# Patient Record
Sex: Male | Born: 1980
Health system: Southern US, Community
[De-identification: ages and names within clinical notes are randomized; demographics above are authoritative.]

## PROBLEM LIST (undated history)

## (undated) ENCOUNTER — Emergency Department (HOSPITAL_COMMUNITY): Payer: BC Managed Care – PPO

## (undated) DIAGNOSIS — S43006A Unspecified dislocation of unspecified shoulder joint, initial encounter: Secondary | ICD-10-CM

---

## 2000-10-15 ENCOUNTER — Encounter: Payer: Self-pay | Admitting: Emergency Medicine

## 2000-10-15 ENCOUNTER — Emergency Department (HOSPITAL_COMMUNITY): Admission: EM | Admit: 2000-10-15 | Discharge: 2000-10-15 | Payer: Self-pay | Admitting: Emergency Medicine

## 2005-12-26 ENCOUNTER — Emergency Department (HOSPITAL_COMMUNITY): Admission: EM | Admit: 2005-12-26 | Discharge: 2005-12-26 | Payer: Self-pay | Admitting: Emergency Medicine

## 2009-08-23 ENCOUNTER — Emergency Department (HOSPITAL_COMMUNITY): Admission: EM | Admit: 2009-08-23 | Discharge: 2009-08-23 | Payer: Self-pay | Admitting: Emergency Medicine

## 2011-03-09 ENCOUNTER — Emergency Department (INDEPENDENT_AMBULATORY_CARE_PROVIDER_SITE_OTHER): Payer: Worker's Compensation

## 2011-03-09 ENCOUNTER — Emergency Department (HOSPITAL_BASED_OUTPATIENT_CLINIC_OR_DEPARTMENT_OTHER)
Admission: EM | Admit: 2011-03-09 | Discharge: 2011-03-09 | Disposition: A | Discharge status: Home / Self Care | Payer: Worker's Compensation | Attending: Emergency Medicine | Admitting: Emergency Medicine

## 2011-03-09 ENCOUNTER — Encounter: Payer: Self-pay | Admitting: Family Medicine

## 2011-03-09 ENCOUNTER — Emergency Department (HOSPITAL_BASED_OUTPATIENT_CLINIC_OR_DEPARTMENT_OTHER): Payer: Worker's Compensation

## 2011-03-09 DIAGNOSIS — M24419 Recurrent dislocation, unspecified shoulder: Secondary | ICD-10-CM | POA: Insufficient documentation

## 2011-03-09 DIAGNOSIS — S43016A Anterior dislocation of unspecified humerus, initial encounter: Secondary | ICD-10-CM

## 2011-03-09 DIAGNOSIS — X58XXXA Exposure to other specified factors, initial encounter: Secondary | ICD-10-CM

## 2011-03-09 MED ORDER — FENTANYL CITRATE 0.05 MG/ML IJ SOLN
100.0000 ug | Freq: Once | INTRAMUSCULAR | Status: AC
  Administered 2011-03-09: 100 ug via INTRAVENOUS
  Filled 2011-03-09: qty 2

## 2011-03-09 NOTE — ED Notes (Signed)
Vital signs stable. 

## 2011-03-09 NOTE — ED Notes (Signed)
Pt set up for conscious sedation procedure and placed on [place] 3 lpm for procedure.

## 2011-03-09 NOTE — ED Notes (Signed)
Patient denies pain and is resting comfortably.  

## 2011-03-09 NOTE — ED Notes (Signed)
Patient is resting comfortably. 

## 2011-03-09 NOTE — ED Notes (Addendum)
Pt c/o left shoulder injury while at work. Pt sts "I think it's dislocated". Pt reports h/o dislocation to same shoulder. Cms intact. Pt sts UDS performed prior to arrival.

## 2011-03-09 NOTE — ED Notes (Signed)
Dr. Radford Pax at Pt. Bedside attempting reduction (closed) of the L shoulder.

## 2011-03-09 NOTE — ED Notes (Signed)
Pt. Is in SR on cardiac monitor with no noted ectopy.

## 2011-03-09 NOTE — ED Notes (Signed)
Pt. Is in pain with deformity to the L shoulder.  Pt. Is able to move the L hand and L wrist

## 2011-03-09 NOTE — ED Notes (Signed)
Pt. Tolerating manipulation of the L shoulder by Dr. Radford Pax

## 2011-03-09 NOTE — ED Notes (Signed)
Post reduction x-ray was done and Pt. Tolerated well.

## 2011-03-09 NOTE — ED Notes (Signed)
Pt. Aware of not to drive tonight.

## 2011-03-09 NOTE — ED Provider Notes (Addendum)
History     CSN: [id number] Arrival date & time: 03/09/2011  6:28 PM   First MD Initiated Contact with Patient 03/09/11 1832      Chief Complaint  Patient presents with  . Shoulder Injury     HPI Patient presents with shoulder dislocation just prior to arrival.  Patient has history of dislocation in the past.  No other complaints. History reviewed. No pertinent past medical history.  History reviewed. No pertinent past surgical history.  No family history on file.  History  Substance Use Topics  . Smoking status: Never Smoker   . Smokeless tobacco: Not on file  . Alcohol Use: No      Review of Systems  All other systems reviewed and are negative.    Allergies  Review of patient's allergies indicates no known allergies.  Home Medications  No current outpatient prescriptions on file.  BP 138/82  Pulse 60  Temp(Src) 97.8 F (36.6 C) (Oral)  Resp 16  Ht 5\' 9"  (1.753 m)  Wt 205 lb (92.987 kg)  BMI 30.27 kg/m2  SpO2 100%  Physical Exam  Constitutional: He is oriented to person, place, and time. He appears well-developed and well-nourished.  Eyes: Pupils are equal, round, and reactive to light.  Neck: Normal range of motion.  Cardiovascular: Normal rate.   Pulmonary/Chest: Effort normal.  Abdominal: Soft.  Musculoskeletal:       Left shoulder: He exhibits deformity, pain and spasm.  Neurological: He is alert and oriented to person, place, and time.  Skin: Skin is warm and dry.    ED Course  Procedures (including critical care time) Initial x-ray showed anterior shoulder dislocation on the left     A closed reduction of anterior shoulder dislocation was done with massage and encouragement.  The reduction occurred spontaneously after some mild manipulation.  Postreduction films were read as normal.  Plan is to place the patient in 8 And followup with primary care doctor or orthopedist   MDM          Nelia Shi, MD 03/09/11  1841  Nelia Shi, MD 03/09/11 2020

## 2011-03-09 NOTE — ED Notes (Signed)
Dr. Radford Pax aware of Pt. Ready and has had the Fentanyl  Ordered with stable vitals.

## 2011-03-20 ENCOUNTER — Encounter (HOSPITAL_COMMUNITY): Payer: Self-pay | Admitting: Emergency Medicine

## 2011-03-20 ENCOUNTER — Emergency Department (HOSPITAL_COMMUNITY)
Admission: EM | Admit: 2011-03-20 | Discharge: 2011-03-20 | Disposition: A | Discharge status: Home / Self Care | Payer: PRIVATE HEALTH INSURANCE | Attending: Emergency Medicine | Admitting: Emergency Medicine

## 2011-03-20 DIAGNOSIS — S43005A Unspecified dislocation of left shoulder joint, initial encounter: Secondary | ICD-10-CM

## 2011-03-20 DIAGNOSIS — M24419 Recurrent dislocation, unspecified shoulder: Secondary | ICD-10-CM | POA: Insufficient documentation

## 2011-03-20 MED ORDER — NAPROXEN 500 MG PO TABS: 500.0000 mg | ORAL_TABLET | Freq: Two times a day (BID) | ORAL | Status: DC

## 2011-03-20 MED ORDER — FENTANYL CITRATE 0.05 MG/ML IJ SOLN
INTRAMUSCULAR | Status: AC
  Administered 2011-03-20: 100 ug via INTRAVENOUS
  Filled 2011-03-20: qty 2

## 2011-03-20 MED ORDER — SODIUM CHLORIDE 0.9 % IV SOLN
Freq: Once | INTRAVENOUS | Status: AC
  Administered 2011-03-20: 02:00:00 via INTRAVENOUS

## 2011-03-20 MED ORDER — FENTANYL CITRATE 0.05 MG/ML IJ SOLN
100.0000 ug | Freq: Once | INTRAMUSCULAR | Status: AC
  Administered 2011-03-20: 100 ug via INTRAVENOUS

## 2011-03-20 NOTE — ED Provider Notes (Addendum)
History     CSN: [id number] Arrival date & time: 03/20/2011  1:42 AM   First MD Initiated Contact with Patient 03/20/11 0143      Chief Complaint  Patient presents with  . Shoulder Injury    (Consider location/radiation/quality/duration/timing/severity/associated sxs/prior treatment) HPI Comments: 30 year old male with a history of left shoulder dislocation that has occurred twice in the past. Once several years ago and then again 5 days ago. Today he returns because of a redislocation while he was not wearing his shoulder immobilizer. Symptoms were acute in onset just prior to arrival, severe, worse with palpation or movement of the left arm. No associated numbness, weakness or tingling in the hand. Symptoms were acute in onset  Patient is a 30 y.o. male presenting with shoulder injury. The history is provided by the patient and medical records.  Shoulder Injury This is a recurrent problem.    History reviewed. No pertinent past medical history.  History reviewed. No pertinent past surgical history.  History reviewed. No pertinent family history.  History  Substance Use Topics  . Smoking status: Never Smoker   . Smokeless tobacco: Not on file  . Alcohol Use: Yes     rarely      Review of Systems  Musculoskeletal:       Shoulder pain  Skin: Negative for rash and wound.  Neurological: Negative for weakness and numbness.    Allergies  Review of patient's allergies indicates no known allergies.  Home Medications   Current Outpatient Rx  Name Route Sig Dispense Refill  . NAPROXEN 500 MG PO TABS Oral Take 1 tablet (500 mg total) by mouth 2 (two) times daily. 30 tablet 0    BP 149/94  Pulse 61  Temp(Src) 97.5 F (36.4 C) (Oral)  Resp 14  Ht 5\' 9"  (1.753 m)  Wt 205 lb (92.987 kg)  BMI 30.27 kg/m2  SpO2 100%  Physical Exam  Nursing note and vitals reviewed. Constitutional: He appears well-developed and well-nourished. He appears distressed ( uncomfortable  appearing).  HENT:  Head: Normocephalic and atraumatic.  Mouth/Throat: Oropharynx is clear and moist. No oropharyngeal exudate.  Eyes: Conjunctivae and EOM are normal. Pupils are equal, round, and reactive to light. Right eye exhibits no discharge. Left eye exhibits no discharge. No scleral icterus.  Neck: Normal range of motion. Neck supple. No JVD present. No thyromegaly present.  Cardiovascular: Normal rate, regular rhythm, normal heart sounds and intact distal pulses.  Exam reveals no gallop and no friction rub.   No murmur heard. Pulmonary/Chest: Effort normal and breath sounds normal. No respiratory distress. He has no wheezes. He has no rales.  Abdominal: Soft. Bowel sounds are normal. He exhibits no distension and no mass. There is no tenderness.  Musculoskeletal: He exhibits tenderness ( Tenderness over the left shoulder. There is obvious deformity of the left shoulder. He is holding his arm in internal rotation.). He exhibits no edema.  Lymphadenopathy:    He has no cervical adenopathy.  Neurological: He is alert. Coordination normal.       Normal grip, sensation, radial pulse at the left arm.  Skin: Skin is warm and dry. No rash noted. No erythema.  Psychiatric: He has a normal mood and affect. His behavior is normal.    ED Course  Reduction of dislocation Date/Time: 03/20/2011 2:30 AM Performed by: Eber Hong D Authorized by: Eber Hong D Consent: Verbal consent obtained. Risks and benefits: risks, benefits and alternatives were discussed Consent given by: patient Patient understanding:  patient states understanding of the procedure being performed Patient consent: the patient's understanding of the procedure matches consent given Procedure consent: procedure consent matches procedure scheduled Relevant documents: relevant documents present and verified Patient identity confirmed: verbally with patient and arm band Time out: Immediately prior to procedure a "time out"  was called to verify the correct patient, procedure, equipment, support staff and site/side marked as required. Local anesthesia used: no Patient sedated: no Patient tolerance: Patient tolerated the procedure well with no immediate complications. Comments: 100 mcg of fentanyl was given intravenously prior to the reduction. Patient was placed in the upright position, scapular manipulation by assistant while I apply gentle traction and external rotation to the left upper extremity. Shoulder reduced without difficulty.  And deformity, pain immediately improved   (including critical care time)  Labs Reviewed - No data to display No results found.   1. Dislocation of left shoulder joint       MDM  Dislocation of the left shoulder. The patient is left-hand dominant. See reduction note above. Sling placed and followup arranged verbally with the patient. The patient states he wants to followup with his workup pointed orthopedist. Recommended rice therapy and anti-inflammatories.        Vida Roller, MD 03/20/11 0865  Vida Roller, MD 03/20/11 [phone]

## 2011-03-20 NOTE — ED Notes (Signed)
Pt c/o left shoulder dislocation. Pt has history of same.

## 2012-01-28 ENCOUNTER — Emergency Department (HOSPITAL_BASED_OUTPATIENT_CLINIC_OR_DEPARTMENT_OTHER): Payer: PRIVATE HEALTH INSURANCE

## 2012-01-28 ENCOUNTER — Emergency Department (HOSPITAL_BASED_OUTPATIENT_CLINIC_OR_DEPARTMENT_OTHER)
Admission: EM | Admit: 2012-01-28 | Discharge: 2012-01-29 | Disposition: A | Discharge status: Home / Self Care | Payer: PRIVATE HEALTH INSURANCE | Attending: Emergency Medicine | Admitting: Emergency Medicine

## 2012-01-28 ENCOUNTER — Encounter (HOSPITAL_BASED_OUTPATIENT_CLINIC_OR_DEPARTMENT_OTHER): Payer: Self-pay | Admitting: *Deleted

## 2012-01-28 DIAGNOSIS — X503XXA Overexertion from repetitive movements, initial encounter: Secondary | ICD-10-CM | POA: Insufficient documentation

## 2012-01-28 DIAGNOSIS — S43006A Unspecified dislocation of unspecified shoulder joint, initial encounter: Secondary | ICD-10-CM | POA: Insufficient documentation

## 2012-01-28 MED ORDER — FENTANYL CITRATE 0.05 MG/ML IJ SOLN
100.0000 ug | Freq: Once | INTRAMUSCULAR | Status: AC
  Administered 2012-01-28: 100 ug via INTRAVENOUS
  Filled 2012-01-28: qty 2

## 2012-01-28 MED ORDER — ETOMIDATE 2 MG/ML IV SOLN
14.0000 mg | Freq: Once | INTRAVENOUS | Status: AC
  Administered 2012-01-28: 14 mg via INTRAVENOUS
  Filled 2012-01-28: qty 10

## 2012-01-28 MED ORDER — NALOXONE HCL 1 MG/ML IJ SOLN
INTRAMUSCULAR | Status: AC
  Filled 2012-01-28: qty 2

## 2012-01-28 MED ORDER — SODIUM CHLORIDE 0.9 % IV SOLN
Freq: Once | INTRAVENOUS | Status: AC
  Administered 2012-01-28: 20 mL/h via INTRAVENOUS

## 2012-01-28 MED ORDER — SODIUM CHLORIDE 0.9 % IV SOLN
INTRAVENOUS | Status: AC | PRN
  Administered 2012-01-28: 20 mL via INTRAVENOUS

## 2012-01-28 NOTE — ED Notes (Signed)
Closed reduction completed, pt tolerated well

## 2012-01-28 NOTE — ED Notes (Signed)
Left shoulder dislocated while moving boxes. Hx of same 5 or more times.

## 2012-01-28 NOTE — ED Provider Notes (Signed)
History   This chart was scribed for Hanley Seamen, MD by Sofie Rower. The patient was seen in room MH02/MH02 and the patient's care was started at 10:48PM    CSN: [id number]  Arrival date & time 01/28/12  2123   First MD Initiated Contact with Patient 01/28/12 2248      Chief Complaint  Patient presents with  . Shoulder Pain    (Consider location/radiation/quality/duration/timing/severity/associated sxs/prior treatment) Patient is a 31 y.o. male presenting with shoulder pain. The history is provided by the patient. No language interpreter was used.  Shoulder Pain This is a new problem. The current episode started 1 to 2 hours ago. The problem occurs constantly. The problem has been gradually worsening. Pertinent negatives include no chest pain, no abdominal pain, no headaches and no shortness of breath. The symptoms are aggravated by twisting and exertion. Nothing relieves the symptoms. He has tried nothing for the symptoms. The treatment provided no relief.    Julian Gibbs is a 31 y.o. male , with a hx of shoulder dislocation (X 5), who presents to the Emergency Department complaining of sudden, progressively worsening shoulder pain located at the left shoulder onset today (9:30PM). The pt reports he was carrying some boxes at his apartment this evening where he believes he dislocated his left shoulder. Modifying factors include certain movements and positions of the left shoulder which intensify the shoulder pain.  The pt denies tingling or numbness located in the left hand.  The pt does not smoke, however, drinks alcohol on occasion.      History reviewed. No pertinent past medical history.  History reviewed. No pertinent past surgical history.  No family history on file.  History  Substance Use Topics  . Smoking status: Never Smoker   . Smokeless tobacco: Not on file  . Alcohol Use: Yes     rarely      Review of Systems  Respiratory: Negative for shortness of  breath.   Cardiovascular: Negative for chest pain.  Gastrointestinal: Negative for abdominal pain.  Neurological: Negative for headaches.  All other systems reviewed and are negative.    Allergies  Review of patient's allergies indicates no known allergies.  Home Medications  No current outpatient prescriptions on file.  BP 129/85  Pulse 97  Temp 98.7 F (37.1 C) (Oral)  Resp 20  SpO2 98%  Physical Exam  Nursing note and vitals reviewed. Constitutional: He is oriented to person, place, and time. He appears well-developed and well-nourished.  HENT:  Head: Atraumatic.  Right Ear: External ear normal.  Left Ear: External ear normal.  Nose: Nose normal.  Eyes: Conjunctivae normal are normal.  Neck: Normal range of motion.  Cardiovascular: Normal rate, regular rhythm and normal heart sounds.   Pulmonary/Chest: Effort normal and breath sounds normal.  Musculoskeletal:       Left shoulder anterior dislocation. Left upper extremity distally and neurovascularly intact.   Neurological: He is alert and oriented to person, place, and time.  Skin: Skin is warm and dry.  Psychiatric: He has a normal mood and affect. His behavior is normal.    ED Course  Procedures (including critical care time)  DIAGNOSTIC STUDIES: Oxygen Saturation is 98% on room air, normal by my interpretation.    COORDINATION OF CARE:    10:51PM- Relocation of the shoulder and treatment plan discussed with pt. Pt agrees to treatment.   11:22PM- Relocation of shoulder. Pt agrees with treatment.   Procedural sedation Performed by: Hanley Seamen Consent:  Verbal and written consent obtained. Risks and benefits: risks, benefits and alternatives were discussed Required items: required blood products, implants, devices, and special equipment available Patient identity confirmed: arm band and provided demographic data Time out: Immediately prior to procedure a "time out" was called to verify the correct  patient, procedure, equipment, support staff and site/side marked as required.  Sedation type: moderate (conscious) sedation NPO time confirmed and considedered  Sedatives: ETOMIDATE  Physician Time at Bedside: 10  Vitals: Vital signs were monitored during sedation. Cardiac Monitor, pulse oximeter Patient tolerance: Patient tolerated the procedure well with no immediate complications. Comments: Pt with uneventful recovered. Returned to pre-procedural sedation baseline.  CLOSED REDUCTION The patient gave verbal and written consent for the procedure as noted above the procedural sedation note. The patient's left trapezius and pectoralis major muscles were massaged prior to reduction. Once adequate sedation was achieved the dislocation was reduced by gentle traction on the left upper arm. There were no complications and the patient tolerated this well. The patient was placed in a shoulder immobilizer. His left upper extremity remained neurovascularly intact post-procedure. Anatomical alignment was verified with post reduction radiograph.     MDM  Nursing notes and vitals signs, including pulse oximetry, reviewed.  Summary of this visit's results, reviewed by myself:   Imaging Studies: Dg Shoulder Left  01/28/2012  *RADIOLOGY REPORT*  Clinical Data: Shoulder pain.  LEFT SHOULDER - 2+ VIEW  Comparison: Plain films 03/09/2011.  Findings: The humerus is anteriorly dislocated with Hill-Sachs deformity identified.  Acromioclavicular joint is intact.  IMPRESSION: Anterior dislocation of the humerus with anterior dislocation of the humerus with a Hill-Sachs deformity.   Original Report Authenticated By: Bernadene Bell. D'ALESSIO, M.D.    Dg Shoulder Left Port  01/29/2012  *RADIOLOGY REPORT*  Clinical Data: Status post reduction of dislocation.  PORTABLE LEFT SHOULDER - 2+ VIEW  Comparison: Plain films 01/28/2012 at 2147 hours.  Findings: The humerus now appears located.  Hill-Sachs deformity noted.   IMPRESSION: Successful reduction of dislocation.   Original Report Authenticated By: Bernadene Bell. D'ALESSIO, M.D.    12:16 AM LUE neurovascularly intact. Patient requests referral to orthopedist for definitive treatment.      I personally performed the services described in this documentation, which was scribed in my presence.  The recorded information has been reviewed and considered.      Hanley Seamen, MD 01/29/12 [phone]

## 2012-01-29 MED ORDER — HYDROCODONE-ACETAMINOPHEN 5-325 MG PO TABS: 1.0000 | ORAL_TABLET | Freq: Four times a day (QID) | ORAL | Status: AC | PRN

## 2012-02-01 ENCOUNTER — Emergency Department (HOSPITAL_BASED_OUTPATIENT_CLINIC_OR_DEPARTMENT_OTHER)
Admission: EM | Admit: 2012-02-01 | Discharge: 2012-02-01 | Disposition: A | Discharge status: Home / Self Care | Payer: PRIVATE HEALTH INSURANCE | Attending: Emergency Medicine | Admitting: Emergency Medicine

## 2012-02-01 ENCOUNTER — Encounter (HOSPITAL_BASED_OUTPATIENT_CLINIC_OR_DEPARTMENT_OTHER): Payer: Self-pay | Admitting: Emergency Medicine

## 2012-02-01 ENCOUNTER — Emergency Department (HOSPITAL_BASED_OUTPATIENT_CLINIC_OR_DEPARTMENT_OTHER): Payer: PRIVATE HEALTH INSURANCE

## 2012-02-01 DIAGNOSIS — X500XXA Overexertion from strenuous movement or load, initial encounter: Secondary | ICD-10-CM | POA: Insufficient documentation

## 2012-02-01 DIAGNOSIS — S43016A Anterior dislocation of unspecified humerus, initial encounter: Secondary | ICD-10-CM | POA: Insufficient documentation

## 2012-02-01 DIAGNOSIS — S43005A Unspecified dislocation of left shoulder joint, initial encounter: Secondary | ICD-10-CM

## 2012-02-01 DIAGNOSIS — Y92009 Unspecified place in unspecified non-institutional (private) residence as the place of occurrence of the external cause: Secondary | ICD-10-CM | POA: Insufficient documentation

## 2012-02-01 MED ORDER — NAPROXEN 500 MG PO TABS: 500.0000 mg | ORAL_TABLET | Freq: Two times a day (BID) | ORAL | Status: DC

## 2012-02-01 MED ORDER — KETOROLAC TROMETHAMINE 60 MG/2ML IM SOLN
60.0000 mg | Freq: Once | INTRAMUSCULAR | Status: AC
  Administered 2012-02-01: 60 mg via INTRAMUSCULAR
  Filled 2012-02-01: qty 2

## 2012-02-01 NOTE — ED Notes (Signed)
Dislocated his left shoulder during sleep.  Hx of same on Monday.  Seen here

## 2012-02-01 NOTE — ED Provider Notes (Signed)
History     CSN: [id number]  Arrival date & time 02/01/12  0545   First MD Initiated Contact with Patient 02/01/12 0615      Chief Complaint  Patient presents with  . Dislocation    (Consider location/radiation/quality/duration/timing/severity/associated sxs/prior treatment) HPI Comments: Julian Gibbs presents with recurrent L shoulder pain - he has hx of recurrent shoulder dislocations X 7 over the last year since he first dislocated it.  He states that 4 days ago he had similar problem and was seen in the ER - Julian shows that pt underwent procedural sedation with etomidate and had successful relocation of the L shoulder - was placed in sling - He had taken teh sling off tonight and awoke from sleep on his stomach with his arm out to the side with pain and deformity.  The pain is constant, worse with movement of the L arm and not associated with numbness of the fingers.  The history is provided by the patient and medical records.    History reviewed. No pertinent past medical history.  History reviewed. No pertinent past surgical history.  No family history on file.  History  Substance Use Topics  . Smoking status: Never Smoker   . Smokeless tobacco: Not on file  . Alcohol Use: Yes     rarely      Review of Systems  Musculoskeletal: Positive for joint swelling.  Neurological: Negative for weakness and numbness.    Allergies  Review of patient's allergies indicates no known allergies.  Home Medications   Current Outpatient Rx  Name Route Sig Dispense Refill  . HYDROCODONE-ACETAMINOPHEN 5-325 MG PO TABS Oral Take 1-2 tablets by mouth every 6 (six) hours as needed for pain. 20 tablet 0  . NAPROXEN 500 MG PO TABS Oral Take 1 tablet (500 mg total) by mouth 2 (two) times daily with a meal. 30 tablet 0    BP 130/87  Pulse 58  Temp 97.9 F (36.6 C) (Oral)  Resp 16  Ht 5\' 10"  (1.778 m)  Wt 205 lb (92.987 kg)  BMI 29.41 kg/m2  SpO2 99%  Physical Exam  Nursing note  and vitals reviewed. Constitutional: He appears well-developed and well-nourished.  HENT:  Head: Normocephalic and atraumatic.  Eyes: Conjunctivae normal are normal. No scleral icterus.  Pulmonary/Chest: Effort normal.  Musculoskeletal: He exhibits tenderness ( ttp over the anterior L shoulder - associated deformity of the L shoulder). He exhibits no edema.  Neurological: He is alert. Coordination normal.       Normal grip and sensation to the L hand  Skin: Skin is warm and dry. No rash noted.    ED Course  Procedures (including critical care time)  Labs Reviewed - No data to display Dg Shoulder Left  02/01/2012  *RADIOLOGY REPORT*  Clinical Data: Left shoulder pain.  LEFT SHOULDER - 2+ VIEW  Comparison: Plain films 01/28/2012.  Findings: Left humerus is anteriorly dislocated.  A Hill-Sachs deformity.  The acromioclavicular joint is intact.  Imaged left lung and ribs are unremarkable.  IMPRESSION: Anterior dislocation left shoulder with associated Hill-Sachs deformity.   Original Report Authenticated By: Bernadene Bell. D'ALESSIO, M.D.      1. Dislocation of left shoulder joint       MDM  Prominent AC joint, likely recurrent anterior / inferior shoulder dislocation - pt has lax capsule and was amenable to reduction using gentle traction.  Please see below note: no sedation was required - IM Toradol given, The joint was clinically reduced,  returned to a normal anatomy and position clinically and pain free after procedure.  Due to the number of xrays that he has had for this same injury, no post reduction films were ordered as he had 100% successful clinical reduction.  Immobilized with sling post reduction.  IM toradol given.  Ortho referral given.    Procedure Note:      Dislocation Reduction  Date, Time: February 01 2012  6:46 AM  Indication: Dislocation of left  shoulder The patient has expressed understanding of the procedure being preformed Risks Benefits and alternatives of the  procedure were give to the patient Verbal Consent obtained from patient Imaging results available showing:  L anterior inferior shoulder dislocation Site Marked Time out called immediately prior to procedure Patient was placed in semifowler position Medicines used:  IM toradol Method used: gentle traction Immobilization applied post reduction Post reduction radiographs:  Not obtained due to clinical redution Patient tolerated procedure without any immediate complications. Approximate time of physician involvement in procedure 5 minutes Well tolerated by patient.  Discharge Prescriptions include:  naprosyn           Vida Roller, MD 02/01/12 [phone]

## 2012-02-04 ENCOUNTER — Emergency Department (HOSPITAL_BASED_OUTPATIENT_CLINIC_OR_DEPARTMENT_OTHER): Payer: PRIVATE HEALTH INSURANCE

## 2012-02-04 ENCOUNTER — Emergency Department (HOSPITAL_BASED_OUTPATIENT_CLINIC_OR_DEPARTMENT_OTHER)
Admission: EM | Admit: 2012-02-04 | Discharge: 2012-02-04 | Disposition: A | Discharge status: Home / Self Care | Payer: PRIVATE HEALTH INSURANCE | Attending: Emergency Medicine | Admitting: Emergency Medicine

## 2012-02-04 ENCOUNTER — Encounter (HOSPITAL_BASED_OUTPATIENT_CLINIC_OR_DEPARTMENT_OTHER): Payer: Self-pay | Admitting: *Deleted

## 2012-02-04 DIAGNOSIS — X58XXXA Exposure to other specified factors, initial encounter: Secondary | ICD-10-CM | POA: Insufficient documentation

## 2012-02-04 DIAGNOSIS — S43005A Unspecified dislocation of left shoulder joint, initial encounter: Secondary | ICD-10-CM

## 2012-02-04 DIAGNOSIS — S43006A Unspecified dislocation of unspecified shoulder joint, initial encounter: Secondary | ICD-10-CM | POA: Insufficient documentation

## 2012-02-04 MED ORDER — OXYCODONE-ACETAMINOPHEN 5-325 MG PO TABS: 1.0000 | ORAL_TABLET | ORAL | Status: DC | PRN

## 2012-02-04 MED ORDER — KETOROLAC TROMETHAMINE 30 MG/ML IJ SOLN
30.0000 mg | Freq: Once | INTRAMUSCULAR | Status: AC
  Administered 2012-02-04: 30 mg via INTRAVENOUS
  Filled 2012-02-04: qty 1

## 2012-02-04 MED ORDER — SODIUM CHLORIDE 0.9 % IV SOLN
Freq: Once | INTRAVENOUS | Status: AC
  Administered 2012-02-04: 05:00:00 via INTRAVENOUS

## 2012-02-04 MED ORDER — FENTANYL CITRATE 0.05 MG/ML IJ SOLN
50.0000 ug | Freq: Once | INTRAMUSCULAR | Status: AC
  Administered 2012-02-04: 50 ug via INTRAVENOUS
  Filled 2012-02-04: qty 2

## 2012-02-04 NOTE — ED Notes (Signed)
Pt arrived by POV but states no one is available to come and get him at this time.  Explained to patient that after treatment we can discuss options

## 2012-02-04 NOTE — ED Provider Notes (Signed)
History     CSN: [id number]  Arrival date & time 02/04/12  4098   First MD Initiated Contact with Patient 02/04/12 0441      Chief Complaint  Patient presents with  . Dislocation    left shoulder    (Consider location/radiation/quality/duration/timing/severity/associated sxs/prior treatment) The history is provided by the patient.   31 year old male has a history of recurrent dislocations of the left shoulder. He has been in the ED several times recently with shoulder dislocations. He woke up from sleep tonight with his left shoulder and out of joint. Pain is moderate and he rates it at 6/10. Denies numbness or tingling or weakness.  History reviewed. No pertinent past medical history.  History reviewed. No pertinent past surgical history.  History reviewed. No pertinent family history.  History  Substance Use Topics  . Smoking status: Never Smoker   . Smokeless tobacco: Not on file  . Alcohol Use: Yes     rarely      Review of Systems  All other systems reviewed and are negative.    Allergies  Review of patient's allergies indicates no known allergies.  Home Medications   Current Outpatient Rx  Name Route Sig Dispense Refill  . HYDROCODONE-ACETAMINOPHEN 5-325 MG PO TABS Oral Take 1-2 tablets by mouth every 6 (six) hours as needed for pain. 20 tablet 0  . NAPROXEN 500 MG PO TABS Oral Take 1 tablet (500 mg total) by mouth 2 (two) times daily with a meal. 30 tablet 0    BP 134/86  Pulse 60  Temp 97.8 F (36.6 C) (Oral)  Resp 16  Ht 5\' 10"  (1.778 m)  Wt 205 lb (92.987 kg)  BMI 29.41 kg/m2  SpO2 99%  Physical Exam  Nursing note and vitals reviewed. 31year old male, resting comfortably and in no acute distress. Vital signs are normal. Oxygen saturation is 99%, which is normal. Head is normocephalic and atraumatic. PERRLA, EOMI. Oropharynx is clear. Neck is nontender and supple without adenopathy or JVD. Back is nontender and there is no CVA  tenderness. Lungs are clear without rales, wheezes, or rhonchi. Chest is nontender. Heart has regular rate and rhythm without murmur. Abdomen is soft, flat, nontender without masses or hepatosplenomegaly and peristalsis is normoactive. Extremities have no cyanosis or edema. Deformity is present of the shoulder consistent with anterior dislocation. Distal neurovascular examination is intact with strong pulses, prompt capillary refill, and normal sensation. Skin is warm and dry without rash. Neurologic: Mental status is normal, cranial nerves are intact, there are no motor or sensory deficits.   ED Course  Reduction of dislocation Date/Time: 02/04/2012 6:33 AM Performed by: Dione Booze Authorized by: Preston Fleeting, Sidney Kann Consent: Verbal consent obtained. Written consent not obtained. Risks and benefits: risks, benefits and alternatives were discussed Consent given by: patient Patient understanding: patient states understanding of the procedure being performed Patient consent: the patient's understanding of the procedure matches consent given Procedure consent: procedure consent matches procedure scheduled Relevant documents: relevant documents present and verified Test results: test results available and properly labeled Site marked: the operative site was marked Imaging studies: imaging studies available Required items: required blood products, implants, devices, and special equipment available Patient identity confirmed: verbally with patient and arm band Time out: Immediately prior to procedure a "time out" was called to verify the correct patient, procedure, equipment, support staff and site/side marked as required. Local anesthesia used: no Patient sedated: no Patient tolerance: Patient tolerated the procedure well with no immediate complications. Comments:  Analgesia was obtained with ketorolac and fentanyl intravenously prior to reduction attempt. The left shoulder was reduced with a  gentleman ablation. Post procedure x-ray has been ordered. Patient tolerated procedure well. Neurovascular examination of the left upper extremity is normal following reduction.   (including critical care time)  Dg Shoulder Left  02/04/2012  *RADIOLOGY REPORT*  Clinical Data: Reduction radiographs.  LEFT SHOULDER - 2+ VIEW  Comparison: 02/04/2012, 0507 hours.  Findings: Successful reduction of left anterior shoulder dislocation.  Hill-Sachs fracture defect is visualized in the lateral humeral head.  No large bony Bankart is identified.  IMPRESSION: Reduction of left anterior shoulder dislocation.   Original Report Authenticated By: Andreas Newport, M.D.    Dg Shoulder Left  02/04/2012  *RADIOLOGY REPORT*  Clinical Data: Left shoulder pain.  LEFT SHOULDER - 2+ VIEW  Comparison: 02/01/2012.  Findings: Recurrent anterior dislocation of the left shoulder.  No large fracture fragments are identified. Hill-Sachs fracture better seen on prior exam.  IMPRESSION: Recurrent left shoulder anterior dislocation.   Original Report Authenticated By: Andreas Newport, M.D.      1. Dislocation of left shoulder joint       MDM  Anterior dislocation of the left shoulder which is recurrent. Prior ED records are reviewed and he has to ED visits for shoulder dislocation the last week and several other visits for shoulder dislocations going back to her last several years. Last ED visit he he was able to be reduced without procedural sedation just using ketorolac for pain control. This will be tried again today.  Reduction was successful. He is placed in a shoulder immobilizer and sent home with prescription for Percocet for pain and referred to orthopedics. He is urged to followup as soon as possible given recurrent dislocations over the last week.      Dione Booze, MD 02/04/12 [phone]

## 2012-02-04 NOTE — ED Notes (Signed)
I fit and applied sling and swath shoulder immobilizer. Patient comfort improved. Ready for discharge.

## 2012-02-04 NOTE — ED Notes (Signed)
Pt presents to ED today with obvious deformity to left shoulder.  Pt states was sleeping and reports no obvious injury.  Pt has very limited movement but can wiggle fingers with +pulses.

## 2012-02-04 NOTE — ED Notes (Signed)
Pt presents to ED today with dislocation to left shoulder.  Pt was sleeping when injury happened.  Pt took no OTC meds PTA.

## 2012-02-04 NOTE — ED Provider Notes (Signed)
After treatment in the ED the patient feels back to baseline and wants to go home.   Nelia Shi, MD 02/04/12 1000

## 2013-04-29 ENCOUNTER — Encounter (HOSPITAL_BASED_OUTPATIENT_CLINIC_OR_DEPARTMENT_OTHER): Payer: Self-pay | Admitting: Emergency Medicine

## 2013-04-29 ENCOUNTER — Emergency Department (HOSPITAL_BASED_OUTPATIENT_CLINIC_OR_DEPARTMENT_OTHER): Payer: PRIVATE HEALTH INSURANCE

## 2013-04-29 ENCOUNTER — Emergency Department (HOSPITAL_BASED_OUTPATIENT_CLINIC_OR_DEPARTMENT_OTHER)
Admission: EM | Admit: 2013-04-29 | Discharge: 2013-04-29 | Disposition: A | Discharge status: Home / Self Care | Payer: PRIVATE HEALTH INSURANCE | Attending: Emergency Medicine | Admitting: Emergency Medicine

## 2013-04-29 DIAGNOSIS — Y9289 Other specified places as the place of occurrence of the external cause: Secondary | ICD-10-CM | POA: Insufficient documentation

## 2013-04-29 DIAGNOSIS — S43005A Unspecified dislocation of left shoulder joint, initial encounter: Secondary | ICD-10-CM

## 2013-04-29 DIAGNOSIS — S43006A Unspecified dislocation of unspecified shoulder joint, initial encounter: Secondary | ICD-10-CM | POA: Insufficient documentation

## 2013-04-29 DIAGNOSIS — Y990 Civilian activity done for income or pay: Secondary | ICD-10-CM | POA: Insufficient documentation

## 2013-04-29 DIAGNOSIS — Z791 Long term (current) use of non-steroidal anti-inflammatories (NSAID): Secondary | ICD-10-CM | POA: Insufficient documentation

## 2013-04-29 DIAGNOSIS — Y939 Activity, unspecified: Secondary | ICD-10-CM | POA: Insufficient documentation

## 2013-04-29 DIAGNOSIS — W010XXA Fall on same level from slipping, tripping and stumbling without subsequent striking against object, initial encounter: Secondary | ICD-10-CM | POA: Insufficient documentation

## 2013-04-29 HISTORY — DX: Unspecified dislocation of unspecified shoulder joint, initial encounter: S43.006A

## 2013-04-29 MED ORDER — OXYCODONE-ACETAMINOPHEN 5-325 MG PO TABS
1.0000 | ORAL_TABLET | Freq: Once | ORAL | Status: AC
  Administered 2013-04-29: 1 via ORAL
  Filled 2013-04-29: qty 1

## 2013-04-29 MED ORDER — IBUPROFEN 800 MG PO TABS: 800.0000 mg | ORAL_TABLET | Freq: Three times a day (TID) | ORAL | Status: DC

## 2013-04-29 MED ORDER — DIAZEPAM 5 MG PO TABS
5.0000 mg | ORAL_TABLET | Freq: Once | ORAL | Status: AC
  Administered 2013-04-29: 5 mg via ORAL
  Filled 2013-04-29: qty 1

## 2013-04-29 NOTE — ED Provider Notes (Signed)
Medical screening examination/treatment/procedure(s) were conducted as a shared visit with non-physician practitioner(s) and myself.  I personally evaluated the patient during the encounter. Patient is a 32 year old male with history of recurrent shoulder dislocations. He slipped at work today and injured his left shoulder again and believes it is dislocated. He denies any other injuries. Denies any numbness or tingling of the hand.  On exam vitals are stable the patient is afebrile. He is awake alert and appropriate. His left shoulder reveals an obvious glenohumeral dislocation. Pulses, motor, and sensory are intact distally.  He was given pain medication and the shoulder was reduced using the [facility] technique. Postreduction films revealed adequate placement and his pulses, motor, and sensory remain intact postreduction. He will be discharged with an arm sling and advised to followup with orthopedics.    Geoffery Lyons, MD 04/29/13 [phone]

## 2013-04-29 NOTE — ED Notes (Signed)
Pt reports he tripped over a chair and "dislocated" his shoulder.

## 2013-04-29 NOTE — ED Provider Notes (Signed)
CSN: [id number]     Arrival date & time 04/29/13  1339 History   First MD Initiated Contact with Patient 04/29/13 1400     Chief Complaint  Patient presents with  . Shoulder Injury   (Consider location/radiation/quality/duration/timing/severity/associated sxs/prior Treatment) Patient is a 32 y.o. male presenting with shoulder injury. The history is provided by the patient. No language interpreter was used.  Shoulder Injury This is a new problem. The current episode started today. Pertinent negatives include no chills or fever. Associated symptoms comments: Here with left shoulder pain like previous recurrent dislocations. He reports tripping over a chair, landing on the shoulder causing pain and deformity. No other injury..    Past Medical History  Diagnosis Date  . Shoulder dislocation    History reviewed. No pertinent past surgical history. No family history on file. History  Substance Use Topics  . Smoking status: Never Smoker   . Smokeless tobacco: Not on file  . Alcohol Use: Yes     Comment: rarely    Review of Systems  Constitutional: Negative for fever and chills.  HENT: Negative.   Respiratory: Negative.   Cardiovascular: Negative.   Gastrointestinal: Negative.   Musculoskeletal: Negative.        See HPI  Skin: Negative.   Neurological: Negative.     Allergies  Review of patient's allergies indicates no known allergies.  Home Medications   Current Outpatient Rx  Name  Route  Sig  Dispense  Refill  . naproxen (NAPROSYN) 500 MG tablet   Oral   Take 1 tablet (500 mg total) by mouth 2 (two) times daily with a meal.   30 tablet   0   . oxyCODONE-acetaminophen (ROXICET) 5-325 MG per tablet   Oral   Take 1 tablet by mouth every 4 (four) hours as needed for pain.   12 tablet   0    BP 142/96  Pulse 70  Temp(Src) 98.1 F (36.7 C) (Oral)  Resp 16  SpO2 100% Physical Exam  Constitutional: He is oriented to person, place, and time. He appears  well-developed and well-nourished.  Neck: Normal range of motion.  Cardiovascular: Intact distal pulses.   Pulmonary/Chest: Effort normal.  Musculoskeletal: Normal range of motion.  Left shoulder has a bony deformity, apparent AC drop-off c/w dislocation. FROM distally. No significant swelling.  Neurological: He is alert and oriented to person, place, and time.  No numbness of shoulder or left upper extremity.  Skin: Skin is warm and dry.  Psychiatric: He has a normal mood and affect.    ED Course  Procedures (including critical care time) Labs Review Labs Reviewed - No data to display Imaging Review Dg Shoulder Left  04/29/2013   CLINICAL DATA:  Dislocated shoulder common deformity  EXAM: LEFT SHOULDER - 2+ VIEW  COMPARISON:  02/04/2012  FINDINGS: There is anterior inferior dislocation of the left shoulder joint. No definite fracture. AC joint aligned. Left lung clear. No rib abnormality.  IMPRESSION: Left shoulder anterior inferior dislocation, recurrent.   Electronically Signed   By: Ruel Favors M.D.   On: 04/29/2013 14:08   Dg Shoulder Left  04/29/2013   CLINICAL DATA:  Postreduction  EXAM: LEFT SHOULDER - 2+ VIEW  COMPARISON:  Prior film same day  FINDINGS: Two views of left shoulder submitted. Postreduction left shoulder in anatomic alignment.  IMPRESSION: Postreduction left shoulder in anatomic alignment.   Electronically Signed   By: Natasha Mead M.D.   On: 04/29/2013 15:43    EKG  Interpretation   None       MDM  No diagnosis found. 1. Shoulder dislocation, left  See note from Dr. Judd Lien regarding shoulder reduction procedure.   Pain is improved. Immobilizer applied. Post-reduction x-ray shows anatomic alignment. Stable for discharge.     Arnoldo Hooker, PA-C 04/29/13 1551

## 2015-09-09 ENCOUNTER — Emergency Department (HOSPITAL_BASED_OUTPATIENT_CLINIC_OR_DEPARTMENT_OTHER)
Admission: EM | Admit: 2015-09-09 | Discharge: 2015-09-09 | Disposition: A | Discharge status: Home / Self Care | Payer: BLUE CROSS/BLUE SHIELD | Attending: Emergency Medicine | Admitting: Emergency Medicine

## 2015-09-09 ENCOUNTER — Encounter (HOSPITAL_BASED_OUTPATIENT_CLINIC_OR_DEPARTMENT_OTHER): Payer: Self-pay | Admitting: Emergency Medicine

## 2015-09-09 DIAGNOSIS — M25532 Pain in left wrist: Secondary | ICD-10-CM | POA: Diagnosis present

## 2015-09-09 DIAGNOSIS — G5622 Lesion of ulnar nerve, left upper limb: Secondary | ICD-10-CM | POA: Diagnosis not present

## 2015-09-09 DIAGNOSIS — E669 Obesity, unspecified: Secondary | ICD-10-CM | POA: Diagnosis not present

## 2015-09-09 MED ORDER — NAPROXEN 500 MG PO TABS: 500.0000 mg | ORAL_TABLET | Freq: Two times a day (BID) | ORAL | Status: DC

## 2015-09-09 MED FILL — NAPROXEN 500 MG TABLET: 500 | 15 days supply | Qty: 30 | Fill #0

## 2015-09-09 NOTE — ED Provider Notes (Signed)
CSN: [id number]     Arrival date & time 09/09/15  9528 History   First MD Initiated Contact with Patient 09/09/15 [phone]     Chief Complaint  Patient presents with  . 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 He states he has developed pain in his left wrist and hand. He is getting a tingling sensation in his small finger and ring finger. It is been no associated injury. He states sometimes there is pain that seems to radiate up and down his arm. But frequently the discomfort is in the ulnar aspect of his hand. This just started over the past day. Past Medical History  Diagnosis Date  . Shoulder dislocation    History reviewed. No pertinent past surgical history. History reviewed. No pertinent family history. Social History  Substance Use Topics  . Smoking status: Never Smoker   . Smokeless tobacco: None  . Alcohol Use: Yes     Comment: rarely    Review of Systems Constitutional: No fever no chills no general illness   Allergies  Review of patient's allergies indicates no known allergies.  Home Medications   Prior to Admission medications   Medication Sig Start Date End Date Taking? Authorizing Provider  ibuprofen (ADVIL,MOTRIN) 800 MG tablet Take 1 tablet (800 mg total) by mouth 3 (three) times daily. 04/29/13   Elpidio Anis, PA-C  naproxen (NAPROSYN) 500 MG tablet Take 1 tablet (500 mg total) by mouth 2 (two) times daily with a meal. 02/01/12   Eber Hong, MD  naproxen (NAPROSYN) 500 MG tablet Take 1 tablet (500 mg total) by mouth 2 (two) times daily. 09/09/15   Arby Barrette, MD  oxyCODONE-acetaminophen (ROXICET) 5-325 MG per tablet Take 1 tablet by mouth every 4 (four) hours as needed for pain. 02/04/12   Dione Booze, MD   BP 136/99 mmHg  Pulse 72  Temp(Src) 98.2 F (36.8 C) (Oral)  Resp 18  Ht  (1.778 m)  Wt 227 lb (102.967 kg)   BMI 32.57 kg/m2  SpO2 99% Physical Exam  Constitutional: He is oriented to person, place, and time.  Patient is moderately obese. He is alert and nontoxic. Well in appearance.  HENT:  Head: Normocephalic and atraumatic.  Eyes: EOM are normal.  Pulmonary/Chest: Effort normal.  Musculoskeletal:  No object of swelling of the left hand or wrist. Some tenderness over the ulnar aspect of the lateral hand and dorsum. No deformity. Pain exacerbated by far extension of the wrist. No tenderness at the median epicondyle. Normal range of motion of the elbow. Hand is neurovascularly intact.  Neurological: He is alert and oriented to person, place, and time. Coordination normal.  Skin: Skin is warm and dry.  Psychiatric: He has a normal mood and affect.    ED Course  Procedures (including critical care time) Labs Review Labs Reviewed - No data to display  Imaging Review No results found. I have personally reviewed and evaluated these images and lab results as part of my medical decision-making.   EKG Interpretation None      MDM   Final diagnoses:  Ulnar nerve compression, left   Patient has fairly acute onset of symptoms consistent with ulnar nerve compression. This appears to be in the wrist. At this time patient will be placed in a splint and anti-inflammatories and icing are recommended. She is advised to follow-up with Dr. Pearletha Forge for response to treatment and  ongoing management.    Arby BarretteMarcy Duha Abair, MD 09/09/15 [phone]

## 2015-09-09 NOTE — ED Notes (Signed)
Patient reports pain to left pinky which increases up his left arm when he moves this finger.  Patient denies injury.  States this began yesterday.

## 2015-09-09 NOTE — Discharge Instructions (Signed)
Guyon's Tunnel Syndrome, Cyclist's Palsy There are 2 typical areas to get a pressure on the ulnar nerve. See outlined descriptions. At this time, wear wrist splint while working and sleeping. Follow-up with Dr. Pearletha Forge for recheck and determination of ongoing treatment. Guyon's tunnel syndrome, also known as cyclist's palsy, is a disorder that causes pain, hand weakness, and loss of feeling in the wrist and little finger side (ulnar) of the hand. The condition is caused by the bones of the hand pressing on the ulnar nerve and other blood vessels on the little finger side of the wrist. This condition may greatly decrease athletic performance in sports that require fine hand function or gripping. SYMPTOMS   Tingling, numbness, or burning feeling in part of the hand or fingers. (Especially little finger, and ring finger on the side by the little finger).  Pain that wakes you up at night.  Sharp pains, that may shoot from the wrist up the arm.  Clumsiness and weakness of the hand.  Shiny, dry skin on the hand.  Reduced performance in sports requiring repeated gripping and fine hand functions (dexterity). CAUSES  The disorder is caused by pressure on the ulnar nerve. This is most likely the result of external pressures (handlebars on a bike), but may also be internal (tumor, cyst, fracture or aneurysm of the ulnar nerve). RISK INCREASES WITH:  Diabetes mellitus.  Underactive thyroid gland (hypothyroidism).  Menopause.  Raynaud's disease (vascular disorder).  Long-distance cycling.  Repeated jolting or shaking of the hands or wrist.  Gout.  Sports that may cause fracture of the hamate bone in the hand (baseball batting, golf, tennis, badminton).  Rheumatoid arthritis.  Ganglion cyst.  Carpal tunnel syndrome. PREVENTION   Use padded handlebars or gloves when cycling.  Change the positions of your wrists often, if your activity requires prolonged hyperextension of the wrist (like  cycling, weightlifting) or results in repeated jolting or shaking of the hands or wrist.  Learn and use proper technique when batting, golfing, or playing tennis, to prevent little finger fractures. PROGNOSIS  This condition is usually curable, either with non-surgical treatment or going away on its own. Sometimes, surgery is needed, especially if muscle wasting or nerve changes have developed.  RELATED COMPLICATIONS   Permanent numbness of the little and ring fingers in the affected hand.  Permanent paralysis, weakness, and clumsiness of hand and finger muscles. TREATMENT  Treatment first consists of stopping activities that aggravate your symptoms. Ice and medicines are then used to reduce pain and inflammation. If possible, activities may be modified so they no longer place as much pressure on the ulnar nerve. This may include wearing padded gloves, a splint or changing your technique. Your caregiver may advise that you visit a physical therapist for further treatment. If inflammation persists, a corticosteroid injection may be given to reduce inflammation. In the most severe cases, surgery is performed to free the compressed nerve, when non-surgical treatment fails. Surgery, which is performed on an outpatient basis (you go home the same day), provides almost complete relief of all symptoms in 95% of patients. Allow at least 2 weeks for healing.  MEDICATION   If pain medicine is needed, nonsteroidal anti-inflammatory medicines (aspirin and ibuprofen), or other minor pain relievers (acetaminophen), are often advised.  Do not take pain medicine for 7 days before surgery.  Stronger pain relievers may be prescribed by your caregiver. Use only as directed and only as much as you need.  Injections of corticosteroids may be recommended, to  reduce inflammation. HEAT AND COLD  Cold treatment (icing) relieves pain and reduces inflammation. Cold treatment should be applied for 10 to 15 minutes every  2 to 3 hours, and immediately after activity that aggravates your symptoms. Use ice packs or an ice massage.  Heat treatment may be used before performing stretching and strengthening activities prescribed by your caregiver, physical therapist, or athletic trainer. Use a heat pack or a warm water soak. SEEK MEDICAL CARE IF:   Symptoms get worse or do not improve, despite 2 weeks of treatment.  New, unexplained symptoms develop. (Drugs used in treatment may produce side effects.)   This information is not intended to replace advice given to you by your health care provider. Make sure you discuss any questions you have with your health care provider.   Document Released: 04/30/2005 Document Revised: 07/23/2011 Document Reviewed: 01/10/2015 Elsevier Interactive Patient Education 2016 [employer]  Cubital Tunnel Syndrome Cubital tunnel syndrome is a condition that causes pain and weakness of the forearm and hand. This condition happens when one of the nerves (ulnar nerve) that runs alongside the elbow joint becomes irritated. CAUSES Causes of this condition include:  Increased pressure on the ulnar nerve at the elbow, arm, or forearm. This can be caused by:  Swollen tissues.  Ligaments.  Muscles.  Poorly healed elbow fractures.  Tumors in the elbow. These are usually noncancerous (benign).  Scar tissue that develops in the elbow after an injury.  Bony growths (spurs) near the ulnar nerve.  Stretching of the nerve due to loose elbow ligaments.  Trauma to the nerve at the elbow.  Repetitive elbow bending.  Certain medical conditions. RISK FACTORS: This condition is more likely to develop in:  People who do manual labor that requires frequently bending the elbow.  People who play sports that include repeated or strenuous throwing motions, such as baseball.  People who play contact sports, such as football or lacrosse.  People who do not warm up properly before  activities.  People who have diabetes.  People who have an underactive thyroid (hypothyroidism). SYMPTOMS Symptoms of this condition include:  Clumsiness or weakness of the hand.  Tenderness of the inner elbow.  Aching or soreness of the inner elbow, forearm, or fingers, especially the little finger or the ring finger.  Increased pain with forced elbow bending.  Reduced control when throwing.  Tingling, numbness, or burning inside the forearm, or in part of the hand or fingers, especially the little finger or the ring finger.  Sharp pains that shoot from the elbow down to the wrist and hand.  The inability to grip or pinch hard. DIAGNOSIS This condition is diagnosed with a medical history and physical exam. Your health care provider will ask about your symptoms and ask for details about any injury. You may also have other tests, including:  Electromyogram (EMG). This test checks how well the nerve is working.  X-ray. TREATMENT Treatment starts by stopping the activities that are causing your symptoms to get worse. Treatment may include the use of icing and medicines to reduce pain and swelling. You may also be advised to wear a splint to prevent your elbow from bending or wear an elbow pad where the ulnar nerve is closest to the skin. In less severe cases, treatment may also include working with a physical therapist:  To help decrease your symptoms.  To improve the strength and range of motion of your elbow, forearm, and hand. If the treatments described above do not  help, surgery may be needed. HOME CARE INSTRUCTIONS If You Have a Splint:  Wear it as told by your health care provider. Remove it only as told by your health care provider.  Loosen the splint if your fingers become numb and tingle, or if they turn cold and blue.  Keep the splint clean and dry. Managing Pain, Stiffness, and Swelling  If directed, apply ice to the injured area:  Put ice in a plastic  bag.  Place a towel between your skin and the bag.  Leave the ice on for 20 minutes, 2-3 times per day.  Move your fingers often to avoid stiffness and to lessen swelling.  Raise (elevate) the injured area above the level of your heart while you are sitting or lying down. General Instructions  Take over-the-counter and prescription medicines only as told by your health care provider.  Keep all follow-up visits as told by your health care provider. This is important.  Do any exercise or physical therapy as told by your health care provider.  Do not drive or operate heavy machinery while taking prescription pain medicine.  If you were given an elbow pad, wear it as told by your health care provider. SEEK MEDICAL CARE IF:  Your symptoms get worse.  Your symptoms do not get better with treatment.  Your have new pain.  Your hand on the injured side feels numb or cold.   This information is not intended to replace advice given to you by your health care provider. Make sure you discuss any questions you have with your health care provider.   Document Released: 04/30/2005 Document Revised: 01/19/2015 Document Reviewed: 07/07/2014 Elsevier Interactive Patient Education [employer]2016 [employer]

## 2016-02-13 ENCOUNTER — Encounter: Payer: Self-pay | Admitting: Family Medicine

## 2016-02-13 ENCOUNTER — Ambulatory Visit (INDEPENDENT_AMBULATORY_CARE_PROVIDER_SITE_OTHER): Payer: BLUE CROSS/BLUE SHIELD | Admitting: Family Medicine

## 2016-02-13 VITALS — BP 126/80 | HR 96 | Temp 98.2°F | Ht 70.5 in | Wt 231.0 lb

## 2016-02-13 DIAGNOSIS — Z113 Encounter for screening for infections with a predominantly sexual mode of transmission: Secondary | ICD-10-CM | POA: Diagnosis not present

## 2016-02-13 DIAGNOSIS — Z130 Encounter for screening for diseases of the blood and blood-forming organs and certain disorders involving the immune mechanism: Secondary | ICD-10-CM

## 2016-02-13 DIAGNOSIS — E663 Overweight: Secondary | ICD-10-CM

## 2016-02-13 DIAGNOSIS — Z13220 Encounter for screening for lipoid disorders: Secondary | ICD-10-CM

## 2016-02-13 DIAGNOSIS — Z131 Encounter for screening for diabetes mellitus: Secondary | ICD-10-CM

## 2016-02-13 LAB — CBC
HCT: 44.4 % (ref 39.0–52.0)
Hemoglobin: 15.2 g/dL (ref 13.0–17.0)
MCHC: 34.3 g/dL (ref 30.0–36.0)
MCV: 88.7 fl (ref 78.0–100.0)
Platelets: 176 10*3/uL (ref 150.0–400.0)
RBC: 5 Mil/uL (ref 4.22–5.81)
RDW: 13.5 % (ref 11.5–15.5)
WBC: 5.3 10*3/uL (ref 4.0–10.5)

## 2016-02-13 LAB — COMPREHENSIVE METABOLIC PANEL
ALBUMIN: 4.2 g/dL (ref 3.5–5.2)
ALT: 41 U/L (ref 0–53)
AST: 22 U/L (ref 0–37)
Alkaline Phosphatase: 93 U/L (ref 39–117)
BUN: 10 mg/dL (ref 6–23)
CHLORIDE: 103 meq/L (ref 96–112)
CO2: 33 meq/L — AB (ref 19–32)
CREATININE: 1.11 mg/dL (ref 0.40–1.50)
Calcium: 9.6 mg/dL (ref 8.4–10.5)
GFR: 96.22 mL/min (ref >60.00)
GLUCOSE: 130 mg/dL — AB (ref 70–99)
Potassium: 4 mEq/L (ref 3.5–5.1)
Sodium: 142 mEq/L (ref 135–145)
Total Bilirubin: 0.4 mg/dL (ref 0.2–1.2)
Total Protein: 7.9 g/dL (ref 6.0–8.3)

## 2016-02-13 LAB — LIPID PANEL
CHOL/HDL RATIO: 5
CHOLESTEROL: 201 mg/dL — AB (ref 0–200)
HDL: 38.4 mg/dL — ABNORMAL LOW (ref >39.00)
NONHDL: 162.94
Triglycerides: 253 mg/dL — ABNORMAL HIGH (ref 0.0–149.0)
VLDL: 50.6 mg/dL — AB (ref 0.0–40.0)

## 2016-02-13 LAB — LDL CHOLESTEROL, DIRECT: LDL DIRECT: 135 mg/dL

## 2016-02-13 LAB — HEMOGLOBIN A1C: Hgb A1c MFr Bld: 6.1 % (ref 4.6–6.5)
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
## 2016-02-13 NOTE — Progress Notes (Signed)
Pre visit review using our clinic review tool, if applicable. No additional management support is needed unless otherwise documented below in the visit note. 

## 2016-02-13 NOTE — Patient Instructions (Addendum)
It was very nice to meet you today- I'll be in touch with your labs asap Do work on gradual weight loss with a goal of losing 20- 30- lbs over several months.  A combination of diet change and exercise is your best bet

## 2016-02-14 ENCOUNTER — Encounter: Payer: Self-pay | Admitting: Family Medicine

## 2016-02-14 DIAGNOSIS — E785 Hyperlipidemia, unspecified: Secondary | ICD-10-CM | POA: Insufficient documentation

## 2016-02-14 DIAGNOSIS — E119 Type 2 diabetes mellitus without complications: Secondary | ICD-10-CM | POA: Insufficient documentation

## 2016-02-14 DIAGNOSIS — R7303 Prediabetes: Secondary | ICD-10-CM | POA: Insufficient documentation

## 2016-02-14 DIAGNOSIS — Z789 Other specified health status: Secondary | ICD-10-CM | POA: Insufficient documentation

## 2016-02-14 LAB — GC/CHLAMYDIA PROBE AMP
CT PROBE, AMP APTIMA: NOT DETECTED
GC PROBE AMP APTIMA: NOT DETECTED

## 2016-02-14 LAB — HEPATITIS C ANTIBODY: HCV Ab: NEGATIVE

## 2016-02-14 LAB — HIV ANTIBODY (ROUTINE TESTING W REFLEX): HIV 1&2 Ab, 4th Generation: NONREACTIVE

## 2016-02-14 LAB — RPR

## 2016-02-14 LAB — HEPATITIS B SURFACE ANTIBODY, QUANTITATIVE: Hepatitis B-Post: 49.2 m[IU]/mL

## 2016-02-14 LAB — HEPATITIS B SURFACE ANTIGEN: HEP B S AG: NEGATIVE

## 2019-09-14 ENCOUNTER — Emergency Department (HOSPITAL_BASED_OUTPATIENT_CLINIC_OR_DEPARTMENT_OTHER): Payer: BC Managed Care – PPO

## 2019-09-14 ENCOUNTER — Other Ambulatory Visit: Payer: Self-pay

## 2019-09-14 ENCOUNTER — Emergency Department (HOSPITAL_BASED_OUTPATIENT_CLINIC_OR_DEPARTMENT_OTHER)
Admission: EM | Admit: 2019-09-14 | Discharge: 2019-09-14 | Disposition: A | Discharge status: Home / Self Care | Payer: BC Managed Care – PPO | Attending: Emergency Medicine | Admitting: Emergency Medicine

## 2019-09-14 ENCOUNTER — Encounter (HOSPITAL_BASED_OUTPATIENT_CLINIC_OR_DEPARTMENT_OTHER): Payer: Self-pay | Admitting: Emergency Medicine

## 2019-09-14 DIAGNOSIS — R42 Dizziness and giddiness: Secondary | ICD-10-CM | POA: Insufficient documentation

## 2019-09-14 DIAGNOSIS — H9319 Tinnitus, unspecified ear: Secondary | ICD-10-CM | POA: Diagnosis not present

## 2019-09-14 LAB — CBG MONITORING, ED: Glucose-Capillary: 121 mg/dL — ABNORMAL HIGH (ref 70–99)

## 2019-09-14 MED ORDER — MECLIZINE HCL 25 MG PO TABS
12.5000 mg | ORAL_TABLET | Freq: Once | ORAL | Status: AC
  Administered 2019-09-14: 12.5 mg via ORAL
  Filled 2019-09-14: qty 1

## 2019-09-14 MED ORDER — MECLIZINE HCL 25 MG PO TABS: 25.0000 mg | ORAL_TABLET | Freq: Three times a day (TID) | ORAL | 0 refills | Status: DC | PRN

## 2019-09-14 MED FILL — MECLIZINE 25 MG TABLET: 25 | 10 days supply | Qty: 30 | Fill #0

## 2019-09-14 NOTE — Discharge Instructions (Addendum)
You were seen in the emergency department for evaluation of ringing in your ears and feeling unbalanced.  You had a CAT scan of your head that did not show any serious findings.  This is likely vertigo and usually will go away on its own.  We are prescribing meclizine which may help your symptoms.  We also put a referral in for physical therapy, which may also help your symptoms.  Please follow-up with your doctor and return to the emergency department for any worsening or concerning symptoms

## 2019-09-14 NOTE — ED Triage Notes (Addendum)
Pt reports issue with balance and ringing in ears x3 weeks.  Sts he feels "wetness" in ears.  Is getting worse and more constant.  Also having more frequent bowel movements for 3 weeks.  Denies diarrhea but has a BM "Several times during the day."

## 2019-09-14 NOTE — ED Provider Notes (Signed)
[facility] EMERGENCY DEPARTMENT Provider Note   CSN: [id number] Arrival date & time: 09/14/19  0931     History Chief Complaint  Patient presents with  . off balance    Julian Gibbs is a 39 y.o. male.  He is complaining of 2 or 3 weeks of ringing in his ears and feeling off balance.  Happens more with position change.  No prior history of same.  No trauma.  Also has noticed that he is having more frequent bowel movements although not diarrhea.  No abdominal pain.  No visual changes no numbness or weakness.  The history is provided by the patient.  Dizziness Quality:  Imbalance and room spinning Severity:  Moderate Onset quality:  Gradual Timing:  Intermittent Progression:  Waxing and waning Chronicity:  New Context: eye movement and head movement   Relieved by:  None tried Worsened by:  Turning head Ineffective treatments:  None tried Associated symptoms: nausea and tinnitus   Associated symptoms: no chest pain, no diarrhea, no headaches, no hearing loss, no palpitations, no shortness of breath, no syncope, no vision changes, no vomiting and no weakness   Risk factors: no hx of stroke, no hx of vertigo, no multiple medications and no new medications        Past Medical History:  Diagnosis Date  . Shoulder dislocation     Patient Active Problem List   Diagnosis Date Noted  . Pre-diabetes 02/14/2016  . Dyslipidemia 02/14/2016  . Hepatitis B immune 02/14/2016  . Overweight 02/13/2016    History reviewed. No pertinent surgical history.     No family history on file.  Social History   Tobacco Use  . Smoking status: Never Smoker  . Smokeless tobacco: Never Used  Substance Use Topics  . Alcohol use: Yes    Comment: rarely  . Drug use: No    Home Medications Prior to Admission medications   Not on File    Allergies    Patient has no known allergies.  Review of Systems   Review of Systems  Constitutional: Negative for fever.  HENT:  Positive for tinnitus. Negative for hearing loss and sore throat.   Eyes: Negative for visual disturbance.  Respiratory: Negative for shortness of breath.   Cardiovascular: Negative for chest pain, palpitations and syncope.  Gastrointestinal: Positive for nausea. Negative for abdominal pain, diarrhea and vomiting.  Genitourinary: Negative for dysuria.  Musculoskeletal: Negative for neck pain.  Skin: Negative for rash.  Neurological: Positive for dizziness. Negative for speech difficulty, weakness and headaches.    Physical Exam Updated Vital Signs BP (!) 143/107 (BP Location: Right Arm)   Pulse 73   Temp 98.5 F (36.9 C) (Oral)   Resp 16   Ht 5\' 10"  (1.778 m)   Wt 103.3 kg   SpO2 98%   BMI 32.67 kg/m   Physical Exam Vitals and nursing note reviewed.  Constitutional:      Appearance: Normal appearance. He is well-developed.  HENT:     Head: Normocephalic and atraumatic.     Right Ear: Tympanic membrane and ear canal normal.     Left Ear: Tympanic membrane and ear canal normal.  Eyes:     Extraocular Movements: Extraocular movements intact.     Right eye: Nystagmus present.     Left eye: Nystagmus present.     Conjunctiva/sclera: Conjunctivae normal.  Cardiovascular:     Rate and Rhythm: Normal rate and regular rhythm.     Heart sounds: No murmur.  Pulmonary:     Effort: Pulmonary effort is normal. No respiratory distress.     Breath sounds: Normal breath sounds.  Abdominal:     Palpations: Abdomen is soft.     Tenderness: There is no abdominal tenderness.  Musculoskeletal:     Cervical back: Neck supple.  Skin:    General: Skin is warm and dry.     Capillary Refill: Capillary refill takes less than 2 seconds.  Neurological:     General: No focal deficit present.     Mental Status: He is alert and oriented to person, place, and time.     Cranial Nerves: No cranial nerve deficit.     Sensory: No sensory deficit.     Motor: No weakness.     Coordination:  Coordination normal.     ED Results / Procedures / Treatments   Labs (all labs ordered are listed, but only abnormal results are displayed) Labs Reviewed  CBG MONITORING, ED - Abnormal; Notable for the following components:      Result Value   Glucose-Capillary 121 (*)    All other components within normal limits    EKG None  Radiology CT Head Wo Contrast  Result Date: 09/14/2019 CLINICAL DATA:  Vertigo EXAM: CT HEAD WITHOUT CONTRAST TECHNIQUE: Contiguous axial images were obtained from the base of the skull through the vertex without intravenous contrast. COMPARISON:  None. FINDINGS: Brain: There is no acute intracranial hemorrhage, mass effect, or edema. Gray-white differentiation is preserved. Focus of low density within the inferior left lentiform nucleus likely reflects a prominent perivascular space or less likely chronic small vessel infarct. There is no extra-axial fluid collection. Ventricles and sulci are within normal limits in size and configuration. Vascular: No hyperdense vessel or unexpected calcification. Skull: Calvarium is unremarkable. Sinuses/Orbits: No acute finding. Other: None. IMPRESSION: No acute intracranial abnormality. Electronically Signed   By: Guadlupe Spanish M.D.   On: 09/14/2019 10:39    Procedures Procedures (including critical care time)  Medications Ordered in ED Medications  meclizine (ANTIVERT) tablet 12.5 mg (has no administration in time range)    ED Course  I have reviewed the triage vital signs and the nursing notes.  Pertinent labs & imaging results that were available during my care of the patient were reviewed by me and considered in my medical decision making (see chart for details).  Clinical Course as of Sep 13 1098  Mon Sep 14, 2019  1040 CT interpreted by me as no acute findings.  Awaiting radiology reading.   [MB]    Clinical Course User Index [MB] Terrilee Files, MD   MDM Rules/Calculators/A&P                      Differential diagnosis includes vertigo, Mnire's, stroke, tumor, TM perforation, otitis media.  His neuro exam is benign other than some nystagmus.  No ataxia here.  Got a head CT chest to make sure there was not a mass.  Have referred on to vestibular physical therapy and treated with meclizine.  We discussed return instructions.  Final Clinical Impression(s) / ED Diagnoses Final diagnoses:  Vertigo    Rx / DC Orders ED Discharge Orders    None       Terrilee Files, MD 09/14/19 1102

## 2019-09-14 NOTE — ED Notes (Signed)
ED Provider at bedside. 

## 2019-09-20 NOTE — Patient Instructions (Signed)
It was good to see you again today, I will be in touch with your labs as soon as possible I suspect you may have Meniere's disease; please see handout I gave you today Let's try the hydrochlorothiazide diuretic once a day- let me know if this seems to help I am going to sent you for an ENT evaluation as well  Let me know if getting worse

## 2019-09-20 NOTE — Progress Notes (Addendum)
[facility] [address], [address] [place], [place] [address] [phone]  Date:  09/21/2019   Name:  Julian Gibbs   DOB:  11-24-1980   MRN:  [id number]  PCP:  Pearline Cables, MD    Chief Complaint: Dizziness (no improvment, swimmy headed)   History of Present Illness:  Julian Gibbs is a 39 y.o. very pleasant male patient who presents with the following:  Patient with history of hyperlipidemia and prediabetes, here today to follow-up on vertigo  Last seen by myself in October 2017-I have only seen this patient once so far-by 36-year-old he is actually a new patient today He was seen in the med center ER on May 3 with diagnosis of vertigo-at that time he was noted to have 2 or 3 weeks of tinnitus and feeling off balance He had a CT of his head which was normal He was given some meclizine; he is not sure how much this helps He notes that he still feels wobbly, still has tinnitus Not really getting better as of yet He is not lightheaded, he has vertigo  No falls- he did stumble once or twice, he also vomited once Never had this in the past  He always has tinnitus since he previously served in [employer], but worse than usual now The vertigo may wax and wane- it will last for hours when it does occur  He also admits to some possible hearing loss which she has noted recently  COVID-19 vaccine - not done yet, encouraged him to do this Tetanus vaccine Routine labs today  BP Readings from Last 3 Encounters:  09/21/19 (!) 143/89  09/14/19 (!) 155/80  02/13/16 126/80     CT Head Wo Contrast  Result Date: 09/14/2019 CLINICAL DATA:  Vertigo EXAM: CT HEAD WITHOUT CONTRAST TECHNIQUE: Contiguous axial images were obtained from the base of the skull through the vertex without intravenous contrast. COMPARISON:  None. FINDINGS: Brain: There is no acute intracranial hemorrhage, mass effect, or edema. Gray-white differentiation  is preserved. Focus of low density within the inferior left lentiform nucleus likely reflects a prominent perivascular space or less likely chronic small vessel infarct. There is no extra-axial fluid collection. Ventricles and sulci are within normal limits in size and configuration. Vascular: No hyperdense vessel or unexpected calcification. Skull: Calvarium is unremarkable. Sinuses/Orbits: No acute finding. Other: None. IMPRESSION: No acute intracranial abnormality. Electronically Signed   By: Guadlupe Spanish M.D.   On: 09/14/2019 10:39     Patient Active Problem List   Diagnosis Date Noted  . Pre-diabetes 02/14/2016  . Dyslipidemia 02/14/2016  . Hepatitis B immune 02/14/2016  . Overweight 02/13/2016    Past Medical History:  Diagnosis Date  . Shoulder dislocation     No past surgical history on file.  Social History   Tobacco Use  . Smoking status: Never Smoker  . Smokeless tobacco: Never Used  Substance Use Topics  . Alcohol use: Yes    Comment: rarely  . Drug use: No    No family history on file.  No Known Allergies  Medication list has been reviewed and updated.  Current Outpatient Medications on File Prior to Visit  Medication Sig Dispense Refill  . meclizine (ANTIVERT) 25 MG tablet Take 1 tablet (25 mg total) by mouth 3 (three) times daily as needed for dizziness. 30 tablet 0   No current facility-administered medications on file prior to visit.  Review of Systems:  As per HPI- otherwise negative.   Physical Examination: Vitals:   09/21/19 1420  BP: (!) 143/89  Pulse: 92  Resp: 17  Temp: 98.6 F (37 C)  SpO2: 98%   Vitals:   09/21/19 1420  Weight: 226 lb (102.5 kg)  Height: 5\' 10"  (1.778 m)   Body mass index is 32.43 kg/m. Ideal Body Weight: Weight in (lb) to have BMI = 25: 173.9  GEN: no acute distress.  Overweight, otherwise looks well HEENT: Atraumatic, Normocephalic.   Bilateral TM wnl, oropharynx normal.  PEERL,EOMI.   Ears and Nose:  No external deformity. CV: RRR, No M/G/R. No JVD. No thrill. No extra heart sounds. PULM: CTA B, no wheezes, crackles, rhonchi. No retractions. No resp. distress. No accessory muscle use. ABD: S, NT, ND EXTR: No c/c/e PSYCH: Normally interactive. Conversant.  Normal strength, sensation, DTR of all limbs.  Normal facial motion and sensation.  He does not step out with Romberg testing, but does well more not expect for a man of his young age   Assessment and Plan: Hospital discharge follow-up  Prediabetes - Plan: Comprehensive metabolic panel, Hemoglobin A1c  Hyperlipidemia, unspecified hyperlipidemia type - Plan: Lipid panel  Screening for deficiency anemia - Plan: CBC  Vertigo - Plan: TSH, Ambulatory referral to ENT, hydrochlorothiazide (HYDRODIURIL) 25 MG tablet, DISCONTINUED: hydrochlorothiazide (HYDRODIURIL) 25 MG tablet  Patient here today with concern of vertigo, tinnitus, possible hearing loss-suggestive of Mnire's disease.  His blood pressure is slightly high, I will go ahead and start him on HCTZ 25 mg and gave patient information about Mnire disease.  Referral to ENT pending.  Routine labs pending as above  I have asked him to let me know if his symptoms or not improved, sooner if worse  Otherwise see me in 2 to 3 months to check on blood pressure and electrolytes Moderate medical decision making today  This visit occurred during the SARS-CoV-2 public health emergency.  Safety protocols were in place, including screening questions prior to the visit, additional usage of staff PPE, and extensive cleaning of exam room while observing appropriate contact time as indicated for disinfecting solutions.    Signed Lamar Blinks, MD  Addendum 5/11, received his labs as below Letter to patient as he does not have my chart  Results for orders placed or performed in visit on 09/21/19  CBC  Result Value Ref Range   WBC 5.3 4.0 - 10.5 K/uL   RBC 4.90 4.22 - 5.81 Mil/uL    Platelets 176.0 150.0 - 400.0 K/uL   Hemoglobin 15.0 13.0 - 17.0 g/dL   HCT 44.1 39.0 - 52.0 %   MCV 90.0 78.0 - 100.0 fl   MCHC 33.9 30.0 - 36.0 g/dL   RDW 13.7 11.5 - 15.5 %  Comprehensive metabolic panel  Result Value Ref Range   Sodium 138 135 - 145 mEq/L   Potassium 4.0 3.5 - 5.1 mEq/L   Chloride 103 96 - 112 mEq/L   CO2 28 19 - 32 mEq/L   Glucose, Bld 92 70 - 99 mg/dL   BUN 12 6 - 23 mg/dL   Creatinine, Ser 1.07 0.40 - 1.50 mg/dL   Total Bilirubin 0.4 0.2 - 1.2 mg/dL   Alkaline Phosphatase 119 (H) 39 - 117 U/L   AST 33 0 - 37 U/L   ALT 67 (H) 0 - 53 U/L   Total Protein 7.6 6.0 - 8.3 g/dL   Albumin 4.4 3.5 - 5.2 g/dL  GFR 92.65 >60.00 mL/min   Calcium 9.5 8.4 - 10.5 mg/dL  Hemoglobin Z6X  Result Value Ref Range   Hgb A1c MFr Bld 6.2 4.6 - 6.5 %  Lipid panel  Result Value Ref Range   Cholesterol 242 (H) 0 - 200 mg/dL   Triglycerides 096.0 (H) 0.0 - 149.0 mg/dL   HDL 45.40 (L) >98.11 mg/dL   VLDL 91.4 (H) 0.0 - 78.2 mg/dL   Total CHOL/HDL Ratio 6    NonHDL 204.12   TSH  Result Value Ref Range   TSH 2.78 0.35 - 4.50 uIU/mL  LDL cholesterol, direct  Result Value Ref Range   Direct LDL 144.0 mg/dL

## 2019-09-21 ENCOUNTER — Other Ambulatory Visit: Payer: Self-pay

## 2019-09-21 ENCOUNTER — Ambulatory Visit (INDEPENDENT_AMBULATORY_CARE_PROVIDER_SITE_OTHER): Payer: BC Managed Care – PPO | Admitting: Family Medicine

## 2019-09-21 ENCOUNTER — Encounter: Payer: Self-pay | Admitting: Family Medicine

## 2019-09-21 VITALS — BP 143/89 | HR 92 | Temp 98.6°F | Resp 17 | Ht 70.0 in | Wt 226.0 lb

## 2019-09-21 DIAGNOSIS — E785 Hyperlipidemia, unspecified: Secondary | ICD-10-CM | POA: Diagnosis not present

## 2019-09-21 DIAGNOSIS — R7303 Prediabetes: Secondary | ICD-10-CM

## 2019-09-21 DIAGNOSIS — Z09 Encounter for follow-up examination after completed treatment for conditions other than malignant neoplasm: Secondary | ICD-10-CM

## 2019-09-21 DIAGNOSIS — R42 Dizziness and giddiness: Secondary | ICD-10-CM | POA: Diagnosis not present

## 2019-09-21 DIAGNOSIS — Z130 Encounter for screening for diseases of the blood and blood-forming organs and certain disorders involving the immune mechanism: Secondary | ICD-10-CM | POA: Diagnosis not present

## 2019-09-21 LAB — COMPREHENSIVE METABOLIC PANEL
ALT: 67 U/L — ABNORMAL HIGH (ref 0–53)
AST: 33 U/L (ref 0–37)
Albumin: 4.4 g/dL (ref 3.5–5.2)
Alkaline Phosphatase: 119 U/L — ABNORMAL HIGH (ref 39–117)
BUN: 12 mg/dL (ref 6–23)
CO2: 28 mEq/L (ref 19–32)
Calcium: 9.5 mg/dL (ref 8.4–10.5)
Chloride: 103 mEq/L (ref 96–112)
Creatinine, Ser: 1.07 mg/dL (ref 0.40–1.50)
GFR: 92.65 mL/min (ref >60.00)
Glucose, Bld: 92 mg/dL (ref 70–99)
Potassium: 4 mEq/L (ref 3.5–5.1)
Sodium: 138 mEq/L (ref 135–145)
Total Bilirubin: 0.4 mg/dL (ref 0.2–1.2)
Total Protein: 7.6 g/dL (ref 6.0–8.3)

## 2019-09-21 LAB — LIPID PANEL
Cholesterol: 242 mg/dL — ABNORMAL HIGH (ref 0–200)
HDL: 37.5 mg/dL — ABNORMAL LOW (ref >39.00)
NonHDL: 204.12
Total CHOL/HDL Ratio: 6
Triglycerides: 262 mg/dL — ABNORMAL HIGH (ref 0.0–149.0)
VLDL: 52.4 mg/dL — ABNORMAL HIGH (ref 0.0–40.0)

## 2019-09-21 LAB — CBC
HCT: 44.1 % (ref 39.0–52.0)
Hemoglobin: 15 g/dL (ref 13.0–17.0)
MCHC: 33.9 g/dL (ref 30.0–36.0)
MCV: 90 fl (ref 78.0–100.0)
Platelets: 176 10*3/uL (ref 150.0–400.0)
RBC: 4.9 Mil/uL (ref 4.22–5.81)
RDW: 13.7 % (ref 11.5–15.5)
WBC: 5.3 10*3/uL (ref 4.0–10.5)

## 2019-09-21 LAB — TSH: TSH: 2.78 u[IU]/mL (ref 0.35–4.50)

## 2019-09-21 LAB — LDL CHOLESTEROL, DIRECT: Direct LDL: 144 mg/dL

## 2019-09-21 LAB — HEMOGLOBIN A1C: Hgb A1c MFr Bld: 6.2 % (ref 4.6–6.5)

## 2019-09-21 MED ORDER — HYDROCHLOROTHIAZIDE 25 MG PO TABS: 25.0000 mg | ORAL_TABLET | Freq: Every day | ORAL | 3 refills | Status: DC

## 2019-10-16 ENCOUNTER — Other Ambulatory Visit: Payer: Self-pay

## 2019-10-16 ENCOUNTER — Encounter (INDEPENDENT_AMBULATORY_CARE_PROVIDER_SITE_OTHER): Payer: Self-pay | Admitting: Otolaryngology

## 2019-10-16 ENCOUNTER — Ambulatory Visit (INDEPENDENT_AMBULATORY_CARE_PROVIDER_SITE_OTHER): Payer: BC Managed Care – PPO | Admitting: Otolaryngology

## 2019-10-16 VITALS — Temp 97.7°F

## 2019-10-16 DIAGNOSIS — R42 Dizziness and giddiness: Secondary | ICD-10-CM
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
## 2019-10-28 ENCOUNTER — Ambulatory Visit (INDEPENDENT_AMBULATORY_CARE_PROVIDER_SITE_OTHER): Payer: BC Managed Care – PPO | Admitting: Otolaryngology

## 2019-10-28 ENCOUNTER — Other Ambulatory Visit: Payer: Self-pay

## 2019-10-28 VITALS — Temp 97.5°F

## 2019-10-28 DIAGNOSIS — H9313 Tinnitus, bilateral: Secondary | ICD-10-CM | POA: Diagnosis not present

## 2019-10-28 DIAGNOSIS — H903 Sensorineural hearing loss, bilateral: Secondary | ICD-10-CM | POA: Diagnosis not present

## 2019-10-28 NOTE — Progress Notes (Signed)
HPI: Julian Gibbs is a 39 y.o. male who returns today for evaluation of tinnitus with audiogram today..  Past Medical History:  Diagnosis Date  . Shoulder dislocation    No past surgical history on file. Social History   Socioeconomic History  . Marital status: Single    Spouse name: Not on file  . Number of children: Not on file  . Years of education: Not on file  . Highest education level: Not on file  Occupational History  . Not on file  Tobacco Use  . Smoking status: Never Smoker  . Smokeless tobacco: Never Used  Substance and Sexual Activity  . Alcohol use: Yes    Comment: rarely  . Drug use: No  . Sexual activity: Not on file  Other Topics Concern  . Not on file  Social History Narrative  . Not on file   Social Determinants of Health   Financial Resource Strain:   . Difficulty of Paying Living Expenses:   Food Insecurity:   . Worried About Programme researcher, broadcasting/film/video in the Last Year:   . Barista in the Last Year:   Transportation Needs:   . Freight forwarder (Medical):   Marland Kitchen Lack of Transportation (Non-Medical):   Physical Activity:   . Days of Exercise per Week:   . Minutes of Exercise per Session:   Stress:   . Feeling of Stress :   Social Connections:   . Frequency of Communication with Friends and Family:   . Frequency of Social Gatherings with Friends and Family:   . Attends Religious Services:   . Active Member of Clubs or Organizations:   . Attends Banker Meetings:   Marland Kitchen Marital Status:    No family history on file. No Known Allergies Prior to Admission medications   Medication Sig Start Date End Date Taking? Authorizing Provider  hydrochlorothiazide (HYDRODIURIL) 25 MG tablet Take 1 tablet (25 mg total) by mouth daily. 09/21/19  Yes Copland, Gwenlyn Found, MD  meclizine (ANTIVERT) 25 MG tablet Take 1 tablet (25 mg total) by mouth 3 (three) times daily as needed for dizziness. 09/14/19  Yes Terrilee Files, MD     Positive ROS:  Otherwise negative  All other systems have been reviewed and were otherwise negative with the exception of those mentioned in the HPI and as above.  Physical Exam: Constitutional: Alert, well-appearing, no acute distress Ears: External ears without lesions or tenderness. Ear canals are clear bilaterally with intact, clear TMs.  Nasal: External nose without lesions. Clear nasal passages Oral: Lips and gums without lesions. Tongue and palate mucosa without lesions. Posterior oropharynx clear. Neck: No palpable adenopathy or masses Respiratory: Breathing comfortably  Skin: No facial/neck lesions or rash noted.  Audiogram demonstrated high-frequency bilateral symmetric sensorineural hearing loss in both ears above 4000 frequency.  He otherwise had normal hearing with SRT's of 10 dB bilateral.  Type A tympanograms bilaterally.  Procedures  Assessment: Tinnitus secondary to high-pitched hearing loss  Plan: Discussed with him concerning minimal treatment options for tinnitus.  Reviewed with him concerning use of masking noise.  Also discussed with him concerning using ear protection when around loud noise.   Narda Bonds, MD

## 2019-10-29 ENCOUNTER — Encounter (INDEPENDENT_AMBULATORY_CARE_PROVIDER_SITE_OTHER): Payer: Self-pay

## 2019-12-08 DIAGNOSIS — I10 Essential (primary) hypertension: Secondary | ICD-10-CM | POA: Insufficient documentation

## 2019-12-08 NOTE — Patient Instructions (Addendum)
Good to see you again today  I will be in touch with your labs asap Your BP looks good today- please continue to check at the pharmacy a few times a year, if you are running higher than 140/90 let me know

## 2019-12-08 NOTE — Progress Notes (Addendum)
[facility] [address] [place], [place] [address] [phone]  Date:  12/16/2019   Name:  Julian Gibbs   DOB:  08-Sep-1980   MRN:  [id number]  PCP:  Pearline Cables, MD    Chief Complaint: Dizziness (1-2 month follow up, feeling better)   History of Present Illness:  Julian Gibbs is a 39 y.o. very pleasant male patient who presents with the following:  Here today for a follow-up visit Last seen by myself in May of this year to follow-up on ER visit for vertigo.  He saw Narda Bonds who dx tinnitus  Started on HCTZ for elevated BP at that time and referred to ENT for eval as well.   Most recent labs showed pre-diabetes and dyslipidemia He actually stopped taking the HCTZ after a month- he does not check BP at home. He did not realize he was meant to continue HCTZ.  However, his BP actually looks good today He continues to have some tinnitus but no more vertigo sx  Pt reports he does have some hearing loss per ENT eval He notes that he feels "great" today and has no acute concerns  Lab Results  Component Value Date   HGBA1C 6.2 09/21/2019    BP Readings from Last 3 Encounters:  12/16/19 122/72  09/21/19 (!) 143/89  09/14/19 (!) 155/80    covid series- not done yet, encouraged pt to get this done, and he does plan to do so soon Tetanus vaccine- he is really not sure but will delay for now so as not to interfere with more urgent covid vaccine Labs done in May - noted mild elevation of AST at that time  Patient Active Problem List   Diagnosis Date Noted  . Essential hypertension 12/08/2019  . Pre-diabetes 02/14/2016  . Dyslipidemia 02/14/2016  . Hepatitis B immune 02/14/2016  . Overweight 02/13/2016    Past Medical History:  Diagnosis Date  . Shoulder dislocation     No past surgical history on file.  Social History   Tobacco Use  . Smoking status: Never Smoker  . Smokeless tobacco: Never  Used  Substance Use Topics  . Alcohol use: Yes    Comment: rarely  . Drug use: No    No family history on file.  No Known Allergies  Medication list has been reviewed and updated.  No current outpatient medications on file prior to visit.   No current facility-administered medications on file prior to visit.    Review of Systems:  As per HPI- otherwise negative.   Physical Examination: Vitals:   12/16/19 0857  BP: 122/72  Pulse: 68  Resp: 17  Temp: 98.3 F (36.8 C)  SpO2: 96%   Vitals:   12/16/19 0857  Weight: 236 lb (107 kg)  Height: 5\' 10"  (1.778 m)   Body mass index is 33.86 kg/m. Ideal Body Weight: Weight in (lb) to have BMI = 25: 173.9  GEN: no acute distress.  Overweight, looks well  HEENT: Atraumatic, Normocephalic.  Ears and Nose: No external deformity. CV: RRR, No M/G/R. No JVD. No thrill. No extra heart sounds. PULM: CTA B, no wheezes, crackles, rhonchi. No retractions. No resp. distress. No accessory muscle use. EXTR: No c/c/e PSYCH: Normally interactive. Conversant.    Assessment and Plan: Prediabetes - Plan: Comprehensive metabolic panel  Hyperlipidemia, unspecified hyperlipidemia type  Essential hypertension - Plan: Comprehensive metabolic panel  Immunization due  Tinnitus,  unspecified laterality  Elevated LFTs  Following up today He is no longer taking HCTZ but his BP looks fine off meds.  Asked him to continue to monitor in the community on occasion and he agrees Tinnitus is better after treatment with diuretic although not resolved Recheck LFTs today Encouraged him to get covid vaccine asap and he plans to do so Defer tetanus for now  Will plan further follow- up pending labs.  This visit occurred during the SARS-CoV-2 public health emergency.  Safety protocols were in place, including screening questions prior to the visit, additional usage of staff PPE, and extensive cleaning of exam room while observing appropriate contact  time as indicated for disinfecting solutions.    Signed Abbe Amsterdam, MD  Received labs as below, letter to patient  Results for orders placed or performed in visit on 12/16/19  Comprehensive metabolic panel  Result Value Ref Range   Sodium 140 135 - 145 mEq/L   Potassium 3.8 3.5 - 5.1 mEq/L   Chloride 105 96 - 112 mEq/L   CO2 28 19 - 32 mEq/L   Glucose, Bld 135 (H) 70 - 99 mg/dL   BUN 12 6 - 23 mg/dL   Creatinine, Ser 0.16 0.40 - 1.50 mg/dL   Total Bilirubin 0.5 0.2 - 1.2 mg/dL   Alkaline Phosphatase 100 39 - 117 U/L   AST 23 0 - 37 U/L   ALT 44 0 - 53 U/L   Total Protein 7.2 6.0 - 8.3 g/dL   Albumin 4.3 3.5 - 5.2 g/dL   GFR 01.09 >32.35 mL/min   Calcium 9.5 8.4 - 10.5 mg/dL

## 2019-12-16 ENCOUNTER — Ambulatory Visit (INDEPENDENT_AMBULATORY_CARE_PROVIDER_SITE_OTHER): Payer: BC Managed Care – PPO | Admitting: Family Medicine

## 2019-12-16 ENCOUNTER — Other Ambulatory Visit: Payer: Self-pay

## 2019-12-16 VITALS — BP 122/72 | HR 68 | Temp 98.3°F | Resp 17 | Ht 70.0 in | Wt 236.0 lb

## 2019-12-16 DIAGNOSIS — H9319 Tinnitus, unspecified ear: Secondary | ICD-10-CM

## 2019-12-16 DIAGNOSIS — I10 Essential (primary) hypertension: Secondary | ICD-10-CM

## 2019-12-16 DIAGNOSIS — E785 Hyperlipidemia, unspecified: Secondary | ICD-10-CM

## 2019-12-16 DIAGNOSIS — R7303 Prediabetes: Secondary | ICD-10-CM

## 2019-12-16 DIAGNOSIS — R7989 Other specified abnormal findings of blood chemistry: Secondary | ICD-10-CM

## 2019-12-16 DIAGNOSIS — Z23 Encounter for immunization: Secondary | ICD-10-CM

## 2019-12-16 LAB — COMPREHENSIVE METABOLIC PANEL
ALT: 44 U/L (ref 0–53)
AST: 23 U/L (ref 0–37)
Albumin: 4.3 g/dL (ref 3.5–5.2)
Alkaline Phosphatase: 100 U/L (ref 39–117)
BUN: 12 mg/dL (ref 6–23)
CO2: 28 mEq/L (ref 19–32)
Calcium: 9.5 mg/dL (ref 8.4–10.5)
Chloride: 105 mEq/L (ref 96–112)
Creatinine, Ser: 1.23 mg/dL (ref 0.40–1.50)
GFR: 78.79 mL/min (ref >60.00)
Glucose, Bld: 135 mg/dL — ABNORMAL HIGH (ref 70–99)
Potassium: 3.8 mEq/L (ref 3.5–5.1)
Sodium: 140 mEq/L (ref 135–145)
Total Bilirubin: 0.5 mg/dL (ref 0.2–1.2)
Total Protein: 7.2 g/dL (ref 6.0–8.3)

## 2021-02-01 DIAGNOSIS — M10071 Idiopathic gout, right ankle and foot: Secondary | ICD-10-CM | POA: Diagnosis not present

## 2021-02-01 DIAGNOSIS — M722 Plantar fascial fibromatosis: Secondary | ICD-10-CM | POA: Diagnosis not present

## 2021-02-08 DIAGNOSIS — Z5181 Encounter for therapeutic drug level monitoring: Secondary | ICD-10-CM | POA: Diagnosis not present

## 2021-04-23 NOTE — Patient Instructions (Addendum)
Good to see you today- I will be in touch with your labs asap  You do get some ingrown hairs on your neck- a toner with salicylic acid may be helpful as part of your routine.  It might also be worth looking into laser hair removal for you   Do work on gradual weight loss- try for a healthy diet most of the time, and try to increase exercise

## 2021-04-23 NOTE — Progress Notes (Addendum)
[facility] [address] [place], [place] [address] [phone]  Date:  04/27/2021   Name:  Julian Gibbs   DOB:  [birth year]   MRN:  [id number]  PCP:  Pearline Cables, MD    Chief Complaint: Annual Exam (Concerns/ questions: none/Flu shot today: yes- but is nervous about it/--Nothing in [facility]---)   History of Present Illness:  Julian Gibbs is a 40 y.o. 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  Social History   Tobacco Use   Smoking status: Never   Smokeless tobacco: Never  Substance Use Topics   Alcohol use: Yes    Comment: rarely   Drug use: No    No family history on file.  No Known Allergies  Medication list has been reviewed and updated.  No current outpatient medications on file prior to visit.   No current facility-administered medications on file prior to visit.    Review of Systems:  As per HPI- otherwise negative.   Physical Examination: Vitals:    04/27/21 1408  BP: 136/80  Pulse: 99  Resp: 18  Temp: 97.9 F (36.6 C)  SpO2: 98%   Vitals:   04/27/21 1408  Weight: 246 lb 9.6 oz (111.9 kg)  Height: 5\' 10"  (1.778 m)   Body mass index is 35.38 kg/m. Ideal Body Weight: Weight in (lb) to have BMI = 25: 173.9  GEN: no acute distress.  Obese, otherwise looks well HEENT: Atraumatic, Normocephalic.  Bilateral TM wnl, oropharynx normal.  PEERL,EOMI.   Ears and Nose: No external deformity. CV: RRR, No M/G/R. No JVD. No thrill. No extra heart sounds. PULM: CTA B, no wheezes, crackles, rhonchi. No retractions. No resp. distress. No accessory muscle use. ABD: S, NT, ND, +BS. No rebound. No HSM. EXTR: No c/c/e PSYCH: Normally interactive. Conversant.  He does have some ingrown hairs in his beard area on the neck which caused bumps.  We discussed this and some management strategies  Assessment and Plan: Physical exam  Prediabetes - Plan: Comprehensive metabolic panel, Hemoglobin A1c  Hyperlipidemia, unspecified hyperlipidemia type - Plan: Lipid panel  Essential hypertension - Plan: CBC, Comprehensive metabolic panel  Screening for deficiency anemia - Plan: CBC  STI (sexually transmitted infection) - Plan: Hepatitis B surface antigen, Hepatitis B surface antibody,quantitative, Hepatitis C antibody, HIV Antibody (routine testing w rflx), RPR, HSV(herpes  simplex vrs) 1+2 ab-IgG  Immunization due - Plan: Tdap vaccine greater than or equal to 7yo IM, Flu Vaccine QUAD 6+ mos PF IM (Fluarix Quad PF)  Patient seen today for physical exam Encouraged healthy diet and exercise routine, encouraged gradual weight loss Update tetanus and flu shots today Recommend COVID series Will plan further follow- up pending labs. Patient has no particular STI symptoms, simply would like screening  Signed Abbe Amsterdam, MD Addendum 12/17 Received labs as below, message to patient-I previously sent him message about STI results  Results for orders  placed or performed in visit on 04/27/21  CBC  Result Value Ref Range   WBC 4.7 4.0 - 10.5 K/uL   RBC 4.58 4.22 - 5.81 Mil/uL   Platelets 168.0 150.0 - 400.0 K/uL   Hemoglobin 13.5 13.0 - 17.0 g/dL   HCT 62.6 94.8 - 54.6 %   MCV 90.4 78.0 - 100.0 fl   MCHC 32.7 30.0 - 36.0 g/dL   RDW 27.0 35.0 - 09.3 %  Comprehensive metabolic panel  Result Value Ref Range   Sodium 141 135 - 145 mEq/L   Potassium 4.0 3.5 - 5.1 mEq/L   Chloride 103 96 - 112 mEq/L   CO2 29 19 - 32 mEq/L   Glucose, Bld 148 (H) 70 - 99 mg/dL   BUN 13 6 - 23 mg/dL   Creatinine, Ser 8.18 0.40 - 1.50 mg/dL   Total Bilirubin 0.4 0.2 - 1.2 mg/dL   Alkaline Phosphatase 99 39 - 117 U/L   AST 23 0 - 37 U/L   ALT 51 0 - 53 U/L   Total Protein 7.3 6.0 - 8.3 g/dL   Albumin 4.3 3.5 - 5.2 g/dL   GFR 29.93 >71.69 mL/min   Calcium 9.8 8.4 - 10.5 mg/dL  Hemoglobin C7E  Result Value Ref Range   Hgb A1c MFr Bld 7.0 (H) 4.6 - 6.5 %  Lipid panel  Result Value Ref Range   Cholesterol 215 (H) 0 - 200 mg/dL   Triglycerides 938.1 (H) 0.0 - 149.0 mg/dL   HDL 01.75 (L) >10.25 mg/dL   VLDL 85.2 (H) 0.0 - 77.8 mg/dL   Total CHOL/HDL Ratio 6    NonHDL 181.19   Hepatitis B surface antigen  Result Value Ref Range   Hepatitis B Surface Ag NON-REACTIVE NON-REACTIVE  Hepatitis B surface antibody,quantitative  Result Value Ref Range   Hepatitis B-Post 60 > OR = 10 mIU/mL  Hepatitis C antibody  Result Value Ref Range   Hepatitis C Ab NON-REACTIVE NON-REACTIVE   SIGNAL TO CUT-OFF 0.03 <1.00  HIV Antibody (routine testing w rflx)  Result Value Ref Range   HIV 1&2 Ab, 4th Generation NON-REACTIVE NON-REACTIVE  RPR  Result Value Ref Range   RPR Ser Ql NON-REACTIVE NON-REACTIVE  HSV(herpes simplex vrs) 1+2 ab-IgG  Result Value Ref Range   HAV 1 IGG,TYPE SPECIFIC AB 1.88 (H) index   HSV 2 IGG,TYPE SPECIFIC AB <0.90 index  LDL cholesterol, direct  Result Value Ref Range   Direct LDL 146.0 mg/dL

## 2021-04-27 ENCOUNTER — Ambulatory Visit (INDEPENDENT_AMBULATORY_CARE_PROVIDER_SITE_OTHER): Payer: BC Managed Care – PPO | Admitting: Family Medicine

## 2021-04-27 VITALS — BP 136/80 | HR 99 | Temp 97.9°F | Resp 18 | Ht 70.0 in | Wt 246.6 lb

## 2021-04-27 DIAGNOSIS — I10 Essential (primary) hypertension: Secondary | ICD-10-CM | POA: Diagnosis not present

## 2021-04-27 DIAGNOSIS — R7303 Prediabetes: Secondary | ICD-10-CM

## 2021-04-27 DIAGNOSIS — Z130 Encounter for screening for diseases of the blood and blood-forming organs and certain disorders involving the immune mechanism: Secondary | ICD-10-CM

## 2021-04-27 DIAGNOSIS — Z23 Encounter for immunization: Secondary | ICD-10-CM

## 2021-04-27 DIAGNOSIS — E785 Hyperlipidemia, unspecified: Secondary | ICD-10-CM | POA: Diagnosis not present

## 2021-04-27 DIAGNOSIS — Z113 Encounter for screening for infections with a predominantly sexual mode of transmission: Secondary | ICD-10-CM

## 2021-04-27 DIAGNOSIS — A64 Unspecified sexually transmitted disease: Secondary | ICD-10-CM | POA: Diagnosis not present

## 2021-04-27 DIAGNOSIS — Z0000 Encounter for general adult medical examination without abnormal findings: Secondary | ICD-10-CM

## 2021-04-28 ENCOUNTER — Encounter: Payer: Self-pay | Admitting: Family Medicine

## 2021-04-28 LAB — COMPREHENSIVE METABOLIC PANEL
ALT: 51 U/L (ref 0–53)
AST: 23 U/L (ref 0–37)
Albumin: 4.3 g/dL (ref 3.5–5.2)
Alkaline Phosphatase: 99 U/L (ref 39–117)
BUN: 13 mg/dL (ref 6–23)
CO2: 29 mEq/L (ref 19–32)
Calcium: 9.8 mg/dL (ref 8.4–10.5)
Chloride: 103 mEq/L (ref 96–112)
Creatinine, Ser: 1.12 mg/dL (ref 0.40–1.50)
GFR: 81.58 mL/min (ref >60.00)
Glucose, Bld: 148 mg/dL — ABNORMAL HIGH (ref 70–99)
Potassium: 4 mEq/L (ref 3.5–5.1)
Sodium: 141 mEq/L (ref 135–145)
Total Bilirubin: 0.4 mg/dL (ref 0.2–1.2)
Total Protein: 7.3 g/dL (ref 6.0–8.3)

## 2021-04-28 LAB — CBC
HCT: 41.4 % (ref 39.0–52.0)
Hemoglobin: 13.5 g/dL (ref 13.0–17.0)
MCHC: 32.7 g/dL (ref 30.0–36.0)
MCV: 90.4 fl (ref 78.0–100.0)
Platelets: 168 10*3/uL (ref 150.0–400.0)
RBC: 4.58 Mil/uL (ref 4.22–5.81)
RDW: 14.2 % (ref 11.5–15.5)
WBC: 4.7 10*3/uL (ref 4.0–10.5)

## 2021-04-28 LAB — LIPID PANEL
Cholesterol: 215 mg/dL — ABNORMAL HIGH (ref 0–200)
HDL: 33.9 mg/dL — ABNORMAL LOW (ref >39.00)
NonHDL: 181.19
Total CHOL/HDL Ratio: 6
Triglycerides: 316 mg/dL — ABNORMAL HIGH (ref 0.0–149.0)
VLDL: 63.2 mg/dL — ABNORMAL HIGH (ref 0.0–40.0)

## 2021-04-28 LAB — HEPATITIS C ANTIBODY
Hepatitis C Ab: NONREACTIVE
SIGNAL TO CUT-OFF: 0.03 (ref <1.00)

## 2021-04-28 LAB — LDL CHOLESTEROL, DIRECT: Direct LDL: 146 mg/dL

## 2021-04-28 LAB — RPR: RPR Ser Ql: NONREACTIVE

## 2021-04-28 LAB — HEMOGLOBIN A1C: Hgb A1c MFr Bld: 7 % — ABNORMAL HIGH (ref 4.6–6.5)

## 2021-04-28 LAB — HEPATITIS B SURFACE ANTIBODY, QUANTITATIVE: Hepatitis B-Post: 60 m[IU]/mL (ref >=10)

## 2021-04-28 LAB — HSV(HERPES SIMPLEX VRS) I + II AB-IGG
HAV 1 IGG,TYPE SPECIFIC AB: 1.88 index — ABNORMAL HIGH
HSV 2 IGG,TYPE SPECIFIC AB: <0.9 index

## 2021-04-28 LAB — HIV ANTIBODY (ROUTINE TESTING W REFLEX): HIV 1&2 Ab, 4th Generation: NONREACTIVE

## 2021-04-28 LAB — HEPATITIS B SURFACE ANTIGEN: Hepatitis B Surface Ag: NONREACTIVE

## 2021-04-29 ENCOUNTER — Encounter: Payer: Self-pay | Admitting: Family Medicine

## 2021-04-29 DIAGNOSIS — R7303 Prediabetes: Secondary | ICD-10-CM

## 2021-04-29 DIAGNOSIS — E785 Hyperlipidemia, unspecified: Secondary | ICD-10-CM

## 2021-05-02 ENCOUNTER — Other Ambulatory Visit (HOSPITAL_BASED_OUTPATIENT_CLINIC_OR_DEPARTMENT_OTHER): Payer: Self-pay

## 2021-05-02 MED ORDER — METFORMIN HCL ER 500 MG PO TB24
500.0000 mg | ORAL_TABLET | Freq: Every day | ORAL | 3 refills | Status: DC
  Filled 2021-05-02: qty 60, 30d supply, fill #0
  Filled 2021-06-16: qty 60, 30d supply, fill #1
  Filled 2021-08-03: qty 60, 30d supply, fill #2
  Filled 2021-09-05: qty 60, 30d supply, fill #3
  Filled 2021-10-06: qty 60, 30d supply, fill #4
  Filled 2021-11-20: qty 60, 30d supply, fill #5
  Filled 2021-12-26: qty 60, 30d supply, fill #6
  Filled 2022-02-09: qty 60, 30d supply, fill #7
  Filled 2022-03-31: qty 60, 30d supply, fill #8

## 2021-05-02 MED ORDER — ROSUVASTATIN CALCIUM 10 MG PO TABS
10.0000 mg | ORAL_TABLET | Freq: Every day | ORAL | 3 refills | Status: DC
  Filled 2021-05-02: qty 30, 30d supply, fill #0
  Filled 2021-06-16: qty 30, 30d supply, fill #1
  Filled 2021-08-03: qty 30, 30d supply, fill #2
  Filled 2021-09-04: qty 30, 30d supply, fill #3
  Filled 2021-10-06: qty 30, 30d supply, fill #4
  Filled 2021-11-20: qty 30, 30d supply, fill #5
  Filled 2021-12-26: qty 30, 30d supply, fill #6
  Filled 2022-02-09: qty 30, 30d supply, fill #7
  Filled 2022-03-31: qty 30, 30d supply, fill #8

## 2021-06-16 ENCOUNTER — Other Ambulatory Visit (HOSPITAL_BASED_OUTPATIENT_CLINIC_OR_DEPARTMENT_OTHER): Payer: Self-pay

## 2021-08-03 ENCOUNTER — Other Ambulatory Visit (HOSPITAL_BASED_OUTPATIENT_CLINIC_OR_DEPARTMENT_OTHER): Payer: Self-pay

## 2021-08-09 ENCOUNTER — Encounter: Payer: Self-pay | Admitting: *Deleted

## 2021-08-16 ENCOUNTER — Encounter: Payer: Self-pay | Admitting: Family Medicine

## 2021-08-16 ENCOUNTER — Ambulatory Visit (INDEPENDENT_AMBULATORY_CARE_PROVIDER_SITE_OTHER): Payer: BC Managed Care – PPO | Admitting: Family Medicine

## 2021-08-16 VITALS — BP 132/82 | HR 68 | Temp 98.0°F | Resp 18 | Ht 70.0 in | Wt 240.0 lb

## 2021-08-16 DIAGNOSIS — E119 Type 2 diabetes mellitus without complications: Secondary | ICD-10-CM | POA: Diagnosis not present

## 2021-08-16 DIAGNOSIS — E785 Hyperlipidemia, unspecified: Secondary | ICD-10-CM | POA: Diagnosis not present

## 2021-08-16 DIAGNOSIS — R7303 Prediabetes: Secondary | ICD-10-CM | POA: Diagnosis not present

## 2021-08-16 LAB — LIPID PANEL
Cholesterol: 114 mg/dL (ref 0–200)
HDL: 31.4 mg/dL — ABNORMAL LOW (ref >39.00)
LDL Cholesterol: 63 mg/dL (ref 0–99)
NonHDL: 82.96
Total CHOL/HDL Ratio: 4
Triglycerides: 101 mg/dL (ref 0.0–149.0)
VLDL: 20.2 mg/dL (ref 0.0–40.0)

## 2021-08-16 LAB — HEMOGLOBIN A1C: Hgb A1c MFr Bld: 6.8 % — ABNORMAL HIGH (ref 4.6–6.5)

## 2021-08-16 NOTE — Progress Notes (Addendum)
[facility]rap[facility], [facility] at [facility] ?[address], [address] ?[place], [place] [address] ?[phone] ?Fax [phone] ? ?Date:  08/16/2021  ? ?Name:  Julian Gibbs   DOB:  29-Mar-1981   MRN:  [id number] ? ?PCP:  Darreld Mclean, MD  ? ? ?Chief Complaint: Follow-up (Concerns/ questions: none/) ? ? ?History of Present Illness: ? ?Julian Gibbs is a 41 y.o. very pleasant male patient who presents with the following: ? ?Following up today on blood sugar and cholesterol ?A1c has been in the prediabetes range, however in December A1c was 7%, dyslipidemia noted ?Technically still diagnosis of prediabetes and only had one A1c greater than 6.5% ? ?Lab Results  ?Component Value Date  ? HGBA1C 7.0 (H) 04/27/2021  ? ?We started metformin for him in December- he is not having no major SE, taking 2 daily now  ?He is also taking crestor and tolerating well  ? ?He is exercising sometimes, encouraged him to work on regular physical activity ? ?Patient Active Problem List  ? Diagnosis Date Noted  ? Essential hypertension 12/08/2019  ? Pre-diabetes 02/14/2016  ? Dyslipidemia 02/14/2016  ? Hepatitis B immune 02/14/2016  ? Overweight 02/13/2016  ? ? ?Past Medical History:  ?Diagnosis Date  ? Shoulder dislocation   ? ? ?No past surgical history on file. ? ?Social History  ? ?Tobacco Use  ? Smoking status: Never  ? Smokeless tobacco: Never  ?Substance Use Topics  ? Alcohol use: Yes  ?  Comment: rarely  ? Drug use: No  ? ? ?No family history on file. ? ?No Known Allergies ? ?Medication list has been reviewed and updated. ? ?Current Outpatient Medications on File Prior to Visit  ?Medication Sig Dispense Refill  ? metFORMIN (GLUCOPHAGE-XR) 500 MG 24 hr tablet Take 1 tablet (500 mg total) by mouth daily with breakfast. Increase to twice daily if well tolerated 180 tablet 3  ? rosuvastatin (CRESTOR) 10 MG tablet Take 1 tablet (10 mg total) by mouth daily. 90 tablet 3  ? ?No current facility-administered medications on file prior  to visit.  ? ? ?Review of Systems: ? ?As per HPI- otherwise negative. ? ? ?Physical Examination: ?Vitals:  ? 08/16/21 1126  ?BP: 132/82  ?Pulse: 68  ?Resp: 18  ?Temp: 98 ?F (36.7 ?C)  ?SpO2: 95%  ? ?Vitals:  ? 08/16/21 1126  ?Weight: 240 lb (108.9 kg)  ?Height: 5\' 10"  (1.778 m)  ? ?Body mass index is 34.44 kg/m?. ?Ideal Body Weight: Weight in (lb) to have BMI = 25: 173.9 ? ?GEN: no acute distress. ?HEENT: Atraumatic, Normocephalic.  ?Ears and Nose: No external deformity. ?CV: RRR, No M/G/R. No JVD. No thrill. No extra heart sounds. ?PULM: CTA B, no wheezes, crackles, rhonchi. No retractions. No resp. distress. No accessory muscle use. ?EXTR: No c/c/e ?PSYCH: Normally interactive. Conversant.  ?Looks well, mildly obese ? ?Assessment and Plan: ?Hyperlipidemia, unspecified hyperlipidemia type - Plan: Lipid panel ? ?Pre-diabetes - Plan: Hemoglobin A1c ? ?Following up today to check effects of metformin and Crestor ?Will plan further follow- up pending labs. ? ? ?Signed ?Lamar Blinks, MD ? ?Received labs as follows, message to patient ? ?Results for orders placed or performed in visit on 08/16/21  ?Hemoglobin A1c  ?Result Value Ref Range  ? Hgb A1c MFr Bld 6.8 (H) 4.6 - 6.5 %  ?Lipid panel  ?Result Value Ref Range  ? Cholesterol 114 0 - 200 mg/dL  ? Triglycerides 101.0 0.0 - 149.0 mg/dL  ?  HDL 31.40 (L) >39.00 mg/dL  ? VLDL 20.2 0.0 - 40.0 mg/dL  ? LDL Cholesterol 63 0 - 99 mg/dL  ? Total CHOL/HDL Ratio 4   ? NonHDL 82.96   ? ? ? ?

## 2021-08-26 IMAGING — CT CT HEAD W/O CM
3 series · 15 of 47 positions shown, 18 images · non-contrast
Comparison: None.

CLINICAL DATA: Vertigo

EXAM:
CT HEAD WITHOUT CONTRAST
TECHNIQUE: Contiguous axial images were obtained from the base of the skull
through the vertex without intravenous contrast.

[Series 2: head wo · axial · 0.44mm/px · z∈[-161,-26]mm · 9 of 33 slices shown, 12 images]
[im 3/33  brain]
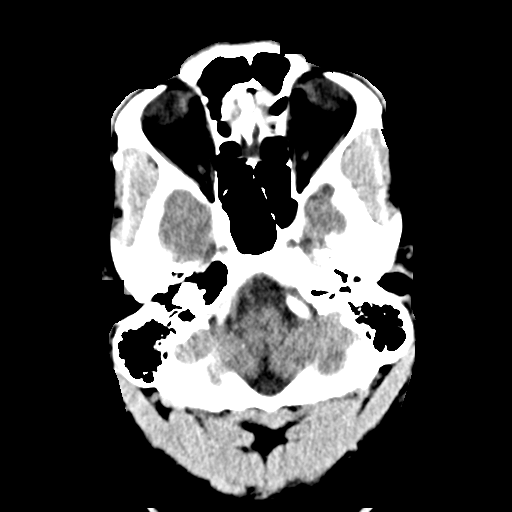
[im 3/33  bone]
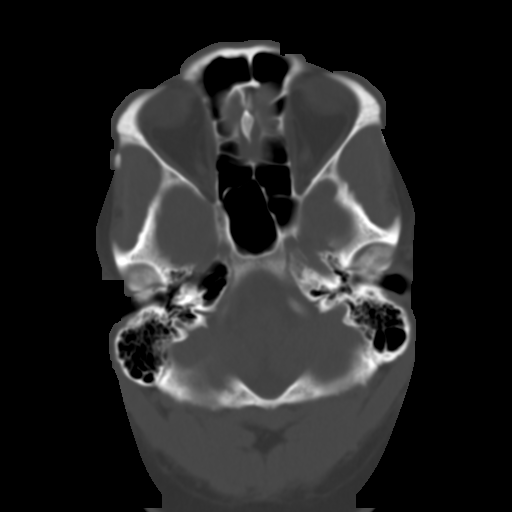
[im 6/33  brain]
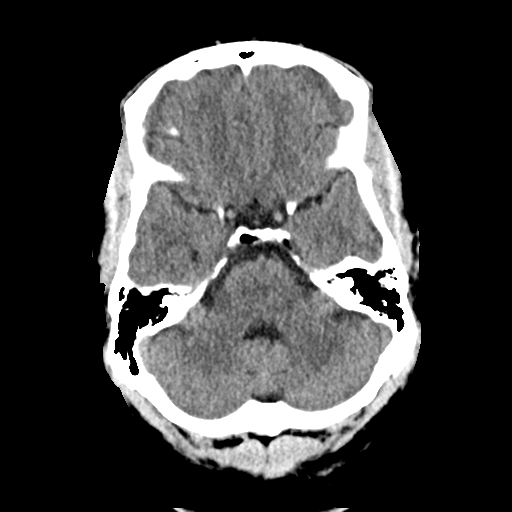
[im 9/33  brain]
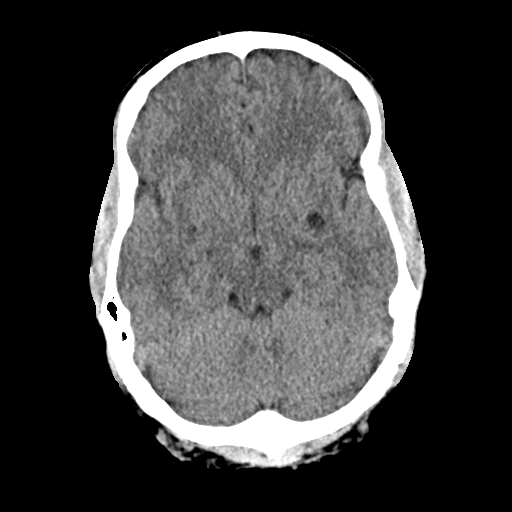
[im 13/33  brain]
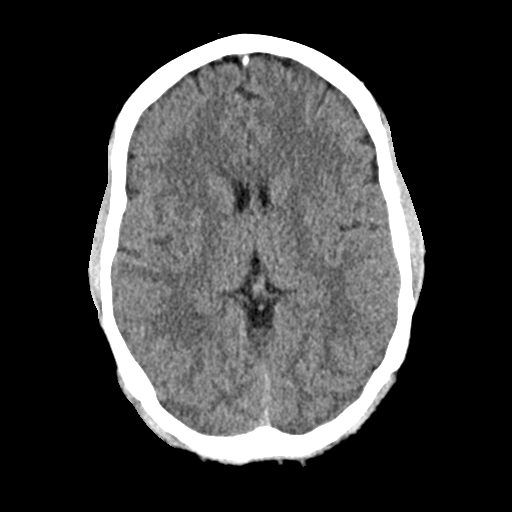
[im 17/33  brain]
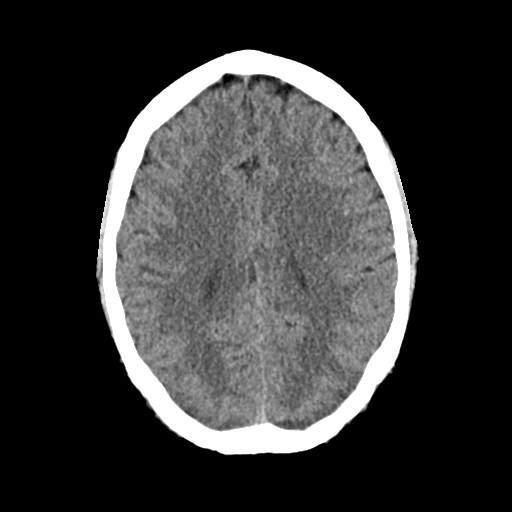
[im 17/33  bone]
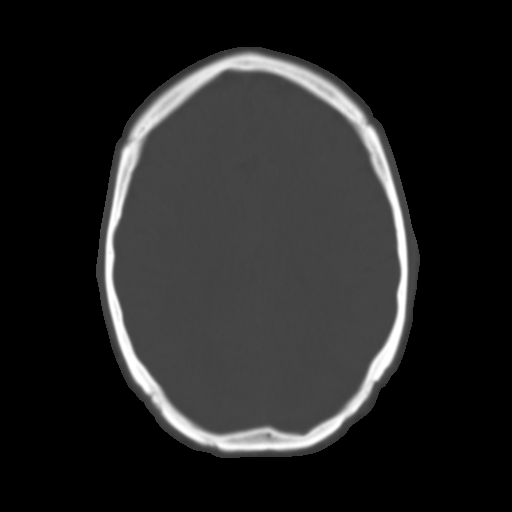
[im 20/33  brain]
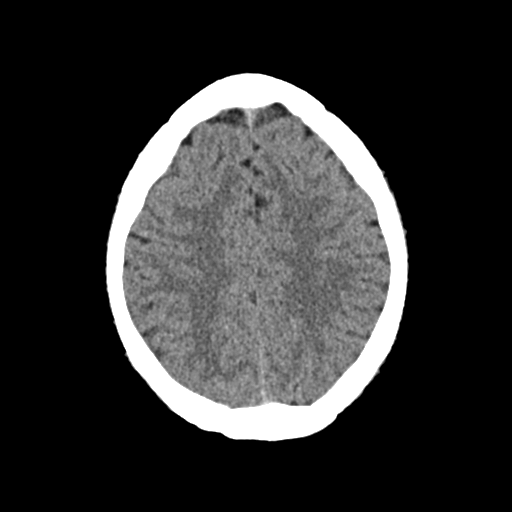
[im 24/33  brain]
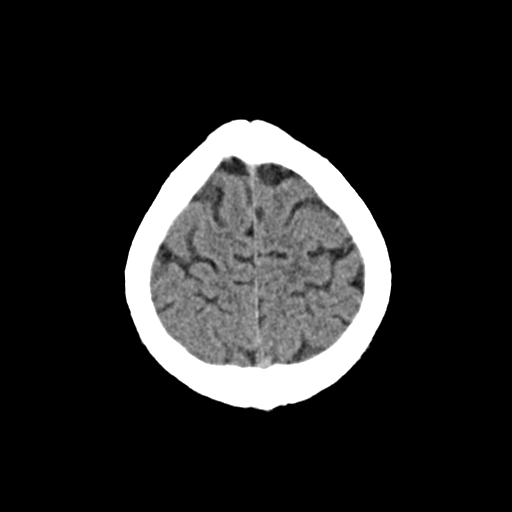
[im 27/33  brain]
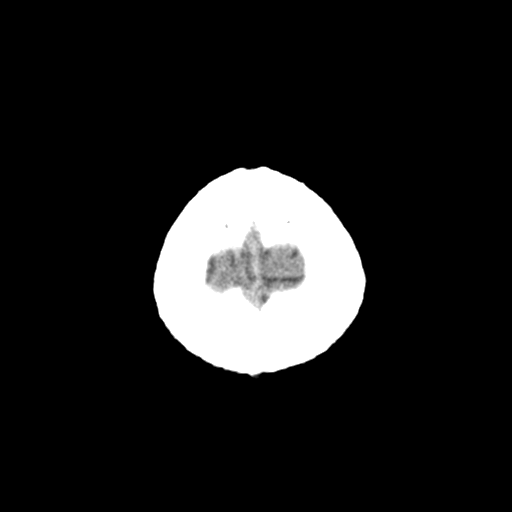
[im 30/33  brain]
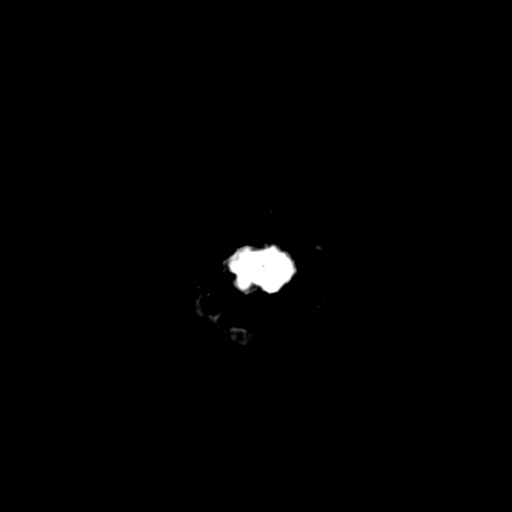
[im 30/33  bone]
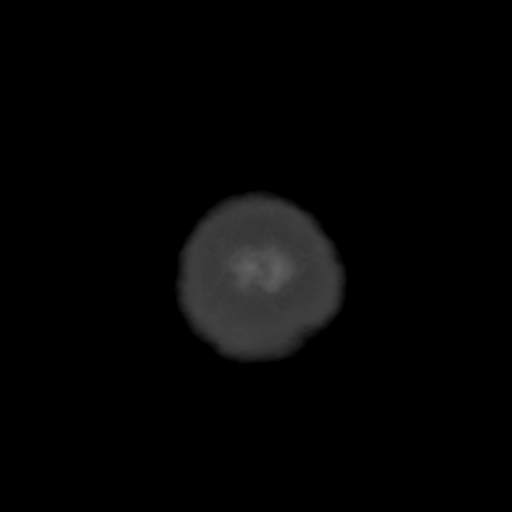

[Series 4: coronal soft · coronal · 0.34mm/px · 3 of 70 slices shown]
[im 24/70  brain]
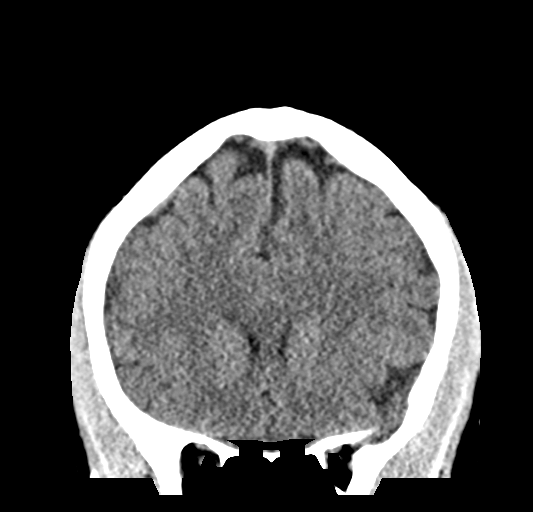
[im 31/70  brain]
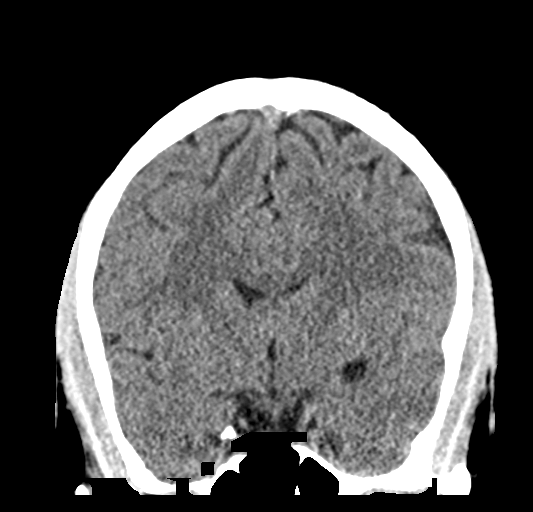
[im 39/70  brain]
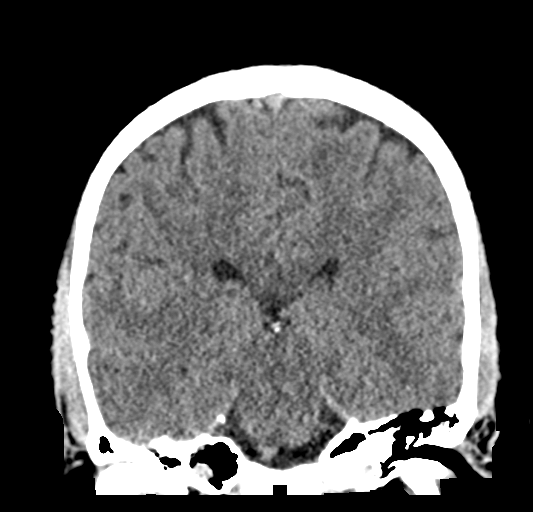

[Series 5: sag soft · sagittal · 0.33mm/px · 3 of 54 slices shown]
[im 18/54  brain]
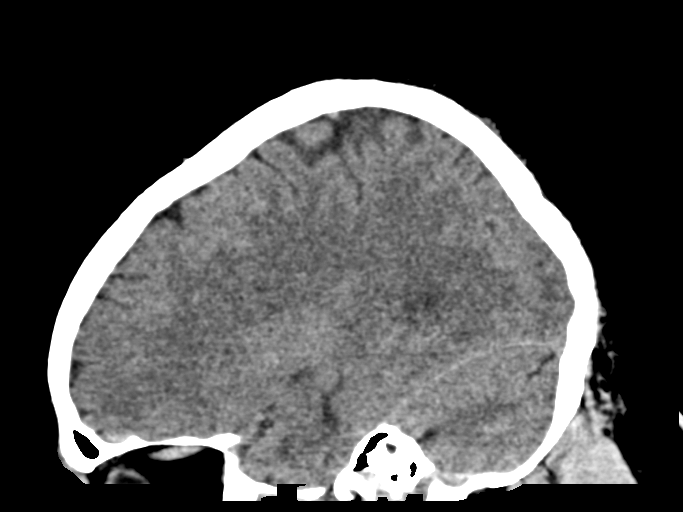
[im 27/54  brain]
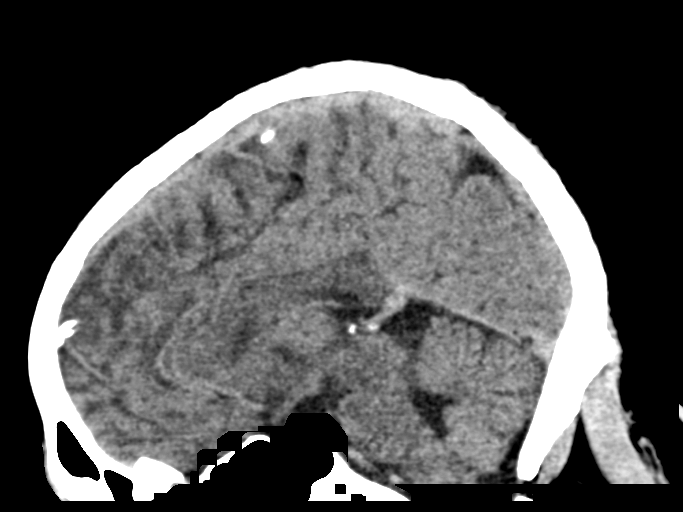
[im 36/54  brain]
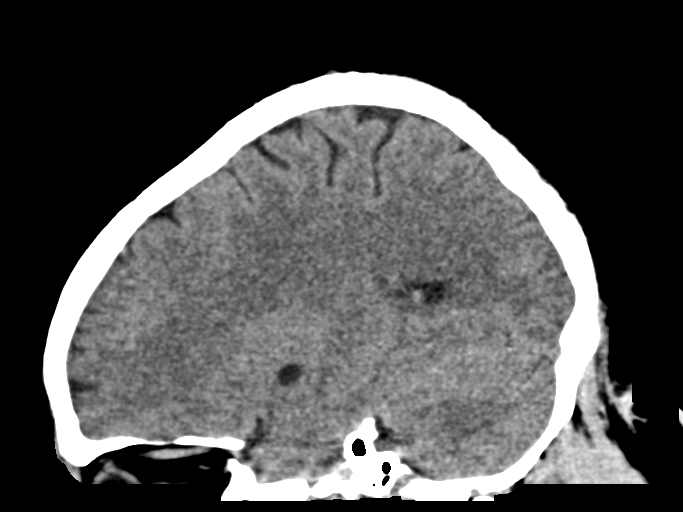

[15 of 47 positions shown; findings below may reference images not displayed]

FINDINGS: Brain: There is no acute intracranial hemorrhage, mass effect, or
edema. Gray-white differentiation is preserved. Focus of low density
within the inferior left lentiform nucleus likely reflects a
prominent perivascular space or less likely chronic small vessel
infarct. There is no extra-axial fluid collection. Ventricles and
sulci are within normal limits in size and configuration.

Vascular: No hyperdense vessel or unexpected calcification.

Skull: Calvarium is unremarkable.

Sinuses/Orbits: No acute finding.

Other: None.
IMPRESSION: No acute intracranial abnormality.

## 2021-09-01 ENCOUNTER — Encounter: Payer: Self-pay | Admitting: Family Medicine

## 2021-09-04 ENCOUNTER — Other Ambulatory Visit (HOSPITAL_BASED_OUTPATIENT_CLINIC_OR_DEPARTMENT_OTHER): Payer: Self-pay

## 2021-09-05 ENCOUNTER — Other Ambulatory Visit (HOSPITAL_BASED_OUTPATIENT_CLINIC_OR_DEPARTMENT_OTHER): Payer: Self-pay

## 2021-10-06 ENCOUNTER — Other Ambulatory Visit (HOSPITAL_BASED_OUTPATIENT_CLINIC_OR_DEPARTMENT_OTHER): Payer: Self-pay

## 2021-11-20 ENCOUNTER — Other Ambulatory Visit (HOSPITAL_BASED_OUTPATIENT_CLINIC_OR_DEPARTMENT_OTHER): Payer: Self-pay

## 2021-11-21 ENCOUNTER — Other Ambulatory Visit (HOSPITAL_BASED_OUTPATIENT_CLINIC_OR_DEPARTMENT_OTHER): Payer: Self-pay

## 2021-12-10 NOTE — Progress Notes (Unsigned)
[facility] [address] [place], [place] [address] [phone]  Date:  12/13/2021   Name:  Julian Gibbs   DOB:  Mar 07, 1981   MRN:  [id number]  PCP:  Pearline Cables, MD    Chief Complaint: Ref to [facility] (Dermatologist referral for scalp- suggested by his barber, referral for Left shoulder pain)   History of Present Illness:  Julian Gibbs is a 41 y.o. very pleasant male patient who presents with the following:  Patient seen today for a couple of concerns History of hypertension, controlled diabetes, overweight, hyperlipidemia Most recent visit with myself was in April-at that time he was taking Crestor, metformin. A1c under good control, cholesterol showed improvement  Foot exam- today  Eye examination- appt later this month Needs urine microalbumin- can update today   Lab Results  Component Value Date   HGBA1C 6.8 (H) 08/16/2021   Metformin 500 mg daily  Crestor 10  He notes an itchy rash on the back of his head- has been there for a few years.  His barber pointed it out to him  Can be uncomfortable and have discharge periodically from the bumps in his skin.  Rashes not present elsewhere on his body  He has noted left shoulder pain for a long time  He injured it years ago- he got upset and punched a door, dislocating his shoulder-he had to be relocated in the hospital. This occurred in his 6s  Since that time he has sometimes had noted easy recurrent dislocation of the left shoulder he also notes some weakness and reduced ROM of the shoulder.  As of yet he has not seen orthopedics for his shoulder  He had a new sexual partner in the last 10 days or so.  They did use a condom, but he has noticed some penile itching and what seems to be some discharge not from the urethra but from the skin of the penis  Patient Active Problem List   Diagnosis Date Noted   Essential hypertension 12/08/2019    Controlled type 2 diabetes mellitus without complication, without long-term current use of insulin (HCC) 02/14/2016   Dyslipidemia 02/14/2016   Hepatitis B immune 02/14/2016   Overweight 02/13/2016    Past Medical History:  Diagnosis Date   Shoulder dislocation     No past surgical history on file.  Social History   Tobacco Use   Smoking status: Never   Smokeless tobacco: Never  Substance Use Topics   Alcohol use: Yes    Comment: rarely   Drug use: No    No family history on file.  No Known Allergies  Medication list has been reviewed and updated.  Current Outpatient Medications on File Prior to Visit  Medication Sig Dispense Refill   metFORMIN (GLUCOPHAGE-XR) 500 MG 24 hr tablet Take 1 tablet (500 mg total) by mouth daily with breakfast. Increase to twice daily if well tolerated 180 tablet 3   rosuvastatin (CRESTOR) 10 MG tablet Take 1 tablet (10 mg total) by mouth daily. 90 tablet 3   No current facility-administered medications on file prior to visit.    Review of Systems:  As per HPI- otherwise negative.   Physical Examination: Vitals:   12/13/21 1124  BP: 134/80  Pulse: 85  Resp: 18  Temp: 97.7 F (36.5 C)  SpO2: 97%   Vitals:   12/13/21 1124  Weight: 223 lb 12.8 oz (101.5 kg)  Height: 5\' 10"  (1.778 m)   Body mass index is 32.11 kg/m. Ideal Body Weight: Weight in (lb) to have BMI = 25: 173.9  GEN: no acute distress.  Mild obesity, looks well  HEENT: Atraumatic, Normocephalic.   Ears and Nose: No external deformity. CV: RRR, No M/G/R. No JVD. No thrill. No extra heart sounds. PULM: CTA B, no wheezes, crackles, rhonchi. No retractions. No resp. distress. No accessory muscle use. ABD: S, NT, ND, +BS. No rebound. No HSM. EXTR: No c/c/e PSYCH: Normally interactive. Conversant.  Left shoulder: Range of motion is normal except for mildly decreased abduction.  Normal internal/external rotation.  However patient does have some discomfort with full  range of motion of shoulder.  Strength is normal Genital exam; no discharge is noted.  The right side of the penis just proximal to the glans does reveal a small area of redness and some weeping.  Does not necessarily appear ulcerated-it is nontender Scalp: See photo below     Assessment and Plan: Hyperlipidemia, unspecified hyperlipidemia type  Controlled type 2 diabetes mellitus without complication, without long-term current use of insulin ([place]) - Plan: Hemoglobin A1c, Microalbumin / creatinine urine ratio, Comprehensive metabolic panel  Closed dislocation of left shoulder girdle, subsequent encounter - Plan: Ambulatory referral to Orthopedic Surgery  Tinea capitis - Plan: terbinafine (LAMISIL) 250 MG tablet, Ambulatory referral to Dermatology, CBC, Comprehensive metabolic panel  Genital lesion, male - Plan: RPR, HIV antibody (with reflex)  Following up on some concerns as above.  Routine labs to monitor lipids and diabetes pending Referral to orthopedics to further evaluate his shoulder Likely tinea capitis.  I did place a dermatology referral but this may take quite some time.  We will treat him with Lamisil for 6 weeks  He has a painless genital ulcer following contact with a new sexual partner.  Check RPR, HIV  Signed Lamar Blinks, MD  Received labs as below, message to patient  Results for orders placed or performed in visit on 12/13/21  Hemoglobin A1c  Result Value Ref Range   Hgb A1c MFr Bld 6.3 4.6 - 6.5 %  Microalbumin / creatinine urine ratio  Result Value Ref Range   Microalb, Ur 1.8 0.0 - 1.9 mg/dL   Creatinine,U 177.1 mg/dL   Microalb Creat Ratio 1.0 0.0 - 30.0 mg/g  CBC  Result Value Ref Range   WBC 4.2 4.0 - 10.5 K/uL   RBC 4.82 4.22 - 5.81 Mil/uL   Platelets 166.0 150.0 - 400.0 K/uL   Hemoglobin 14.1 13.0 - 17.0 g/dL   HCT 42.8 39.0 - 52.0 %   MCV 88.8 78.0 - 100.0 fl   MCHC 33.0 30.0 - 36.0 g/dL   RDW 13.7 11.5 - 15.5 %  Comprehensive metabolic  panel  Result Value Ref Range   Sodium 142 135 - 145 mEq/L   Potassium 4.2 3.5 - 5.1 mEq/L   Chloride 106 96 - 112 mEq/L   CO2 27 19 - 32 mEq/L   Glucose, Bld 110 (H) 70 - 99 mg/dL   BUN 11 6 - 23 mg/dL   Creatinine, Ser 1.12 0.40 - 1.50 mg/dL   Total Bilirubin 0.4 0.2 - 1.2 mg/dL   Alkaline Phosphatase 108 39 - 117 U/L   AST 19 0 - 37 U/L   ALT 30 0 - 53 U/L   Total Protein 7.7 6.0 - 8.3 g/dL   Albumin 4.5 3.5 - 5.2 g/dL   GFR 81.23 >60.00 mL/min   Calcium 9.4 8.4 -  10.5 mg/dL

## 2021-12-10 NOTE — Patient Instructions (Incomplete)
Good to see you again today I do recommend getting the COVID-19 series if not done already  Today- labs -referral to dermatology -referral to orthopedics -treat scalp infection with terbinafine 250 daily for 6 weeks -screen for HIV and syphilis

## 2021-12-13 ENCOUNTER — Other Ambulatory Visit (HOSPITAL_BASED_OUTPATIENT_CLINIC_OR_DEPARTMENT_OTHER): Payer: Self-pay

## 2021-12-13 ENCOUNTER — Ambulatory Visit (INDEPENDENT_AMBULATORY_CARE_PROVIDER_SITE_OTHER): Payer: BC Managed Care – PPO | Admitting: Family Medicine

## 2021-12-13 ENCOUNTER — Encounter: Payer: Self-pay | Admitting: Family Medicine

## 2021-12-13 ENCOUNTER — Other Ambulatory Visit: Payer: Self-pay

## 2021-12-13 VITALS — BP 134/80 | HR 85 | Temp 97.7°F | Resp 18 | Ht 70.0 in | Wt 223.8 lb

## 2021-12-13 DIAGNOSIS — S43305D Dislocation of unspecified parts of left shoulder girdle, subsequent encounter: Secondary | ICD-10-CM | POA: Diagnosis not present

## 2021-12-13 DIAGNOSIS — E785 Hyperlipidemia, unspecified: Secondary | ICD-10-CM

## 2021-12-13 DIAGNOSIS — N5089 Other specified disorders of the male genital organs: Secondary | ICD-10-CM | POA: Diagnosis not present

## 2021-12-13 DIAGNOSIS — B350 Tinea barbae and tinea capitis: Secondary | ICD-10-CM

## 2021-12-13 DIAGNOSIS — E119 Type 2 diabetes mellitus without complications: Secondary | ICD-10-CM | POA: Diagnosis not present

## 2021-12-13 LAB — COMPREHENSIVE METABOLIC PANEL
ALT: 30 U/L (ref 0–53)
AST: 19 U/L (ref 0–37)
Albumin: 4.5 g/dL (ref 3.5–5.2)
Alkaline Phosphatase: 108 U/L (ref 39–117)
BUN: 11 mg/dL (ref 6–23)
CO2: 27 mEq/L (ref 19–32)
Calcium: 9.4 mg/dL (ref 8.4–10.5)
Chloride: 106 mEq/L (ref 96–112)
Creatinine, Ser: 1.12 mg/dL (ref 0.40–1.50)
GFR: 81.23 mL/min (ref >60.00)
Glucose, Bld: 110 mg/dL — ABNORMAL HIGH (ref 70–99)
Potassium: 4.2 mEq/L (ref 3.5–5.1)
Sodium: 142 mEq/L (ref 135–145)
Total Bilirubin: 0.4 mg/dL (ref 0.2–1.2)
Total Protein: 7.7 g/dL (ref 6.0–8.3)

## 2021-12-13 LAB — CBC
HCT: 42.8 % (ref 39.0–52.0)
Hemoglobin: 14.1 g/dL (ref 13.0–17.0)
MCHC: 33 g/dL (ref 30.0–36.0)
MCV: 88.8 fl (ref 78.0–100.0)
Platelets: 166 10*3/uL (ref 150.0–400.0)
RBC: 4.82 Mil/uL (ref 4.22–5.81)
RDW: 13.7 % (ref 11.5–15.5)
WBC: 4.2 10*3/uL (ref 4.0–10.5)

## 2021-12-13 LAB — MICROALBUMIN / CREATININE URINE RATIO
Creatinine,U: 177.1 mg/dL
Microalb Creat Ratio: 1 mg/g (ref 0.0–30.0)
Microalb, Ur: 1.8 mg/dL (ref 0.0–1.9)

## 2021-12-13 LAB — HEMOGLOBIN A1C: Hgb A1c MFr Bld: 6.3 % (ref 4.6–6.5)

## 2021-12-13 MED ORDER — TERBINAFINE HCL 250 MG PO TABS
250.0000 mg | ORAL_TABLET | Freq: Every day | ORAL | 0 refills | Status: AC
  Filled 2021-12-13: qty 30, 30d supply, fill #0

## 2021-12-14 ENCOUNTER — Encounter: Payer: Self-pay | Admitting: Family Medicine

## 2021-12-14 LAB — HIV ANTIBODY (ROUTINE TESTING W REFLEX): HIV 1&2 Ab, 4th Generation: NONREACTIVE

## 2021-12-14 LAB — RPR: RPR Ser Ql: NONREACTIVE

## 2021-12-15 ENCOUNTER — Encounter: Payer: Self-pay | Admitting: Family Medicine

## 2021-12-26 ENCOUNTER — Other Ambulatory Visit (HOSPITAL_BASED_OUTPATIENT_CLINIC_OR_DEPARTMENT_OTHER): Payer: Self-pay

## 2021-12-26 DIAGNOSIS — L730 Acne keloid: Secondary | ICD-10-CM | POA: Diagnosis not present

## 2021-12-26 MED ORDER — CLINDAMYCIN PHOSPHATE 1 % EX LOTN
TOPICAL_LOTION | CUTANEOUS | 11 refills | Status: AC
  Filled 2021-12-26: qty 60, 30d supply, fill #0

## 2021-12-26 MED ORDER — DOXYCYCLINE HYCLATE 100 MG PO CAPS
ORAL_CAPSULE | ORAL | 0 refills | Status: AC
  Filled 2021-12-26: qty 60, 30d supply, fill #0

## 2022-01-02 NOTE — Progress Notes (Unsigned)
[facility] [address] [place], [place] [address] [phone]  Date:  01/04/2022   Name:  Julian Gibbs   DOB:  Jan 17, 1981   MRN:  [id number]  PCP:  Pearline Cables, MD    Chief Complaint: genital lesion (Ongoing since last OV- no improvement. )   History of Present Illness:  Julian Gibbs is a 41 y.o. very pleasant male patient who presents with the following:  Following up today for a genital ulcer Last seen by myself earlier this month History of hypertension, controlled diabetes, overweight, hyperlipidemia At our visit on 8/2 he had a painless genital concern (small area of redness and weeping near glans on the right-hand side)- RPR was negative, HIV negative  Consider chancroid (Azithromycin 1 g orally) Lymphogranuloma Venereum or other dermatitis  The recommended treatment regimen for LV is doxycycline 100 mg orally twice a day given for 21 days.[16]  No discharge from the end of the penis He otherwise feels well No dysuria  He recently saw dermatology, was started on long-term doxycycline for scalp folliculitis.  He is taking doxycycline 100 twice daily  Lab Results  Component Value Date   HGBA1C 6.3 12/13/2021    Patient Active Problem List   Diagnosis Date Noted   Essential hypertension 12/08/2019   Controlled type 2 diabetes mellitus without complication, without long-term current use of insulin (HCC) 02/14/2016   Dyslipidemia 02/14/2016   Hepatitis B immune 02/14/2016   Overweight 02/13/2016    Past Medical History:  Diagnosis Date   Shoulder dislocation     No past surgical history on file.  Social History   Tobacco Use   Smoking status: Never   Smokeless tobacco: Never  Substance Use Topics   Alcohol use: Yes    Comment: rarely   Drug use: No    No family history on file.  No Known Allergies  Medication list has been reviewed and updated.  Current Outpatient  Medications on File Prior to Visit  Medication Sig Dispense Refill   clindamycin (CLEOCIN T) 1 % lotion Apply to face and other affected areas twice daily. 60 mL 11   doxycycline (VIBRAMYCIN) 100 MG capsule Take 1 capsule by mouth 2 times a day. Take with water one hour after a meal. 180 capsule 0   metFORMIN (GLUCOPHAGE-XR) 500 MG 24 hr tablet Take 1 tablet (500 mg total) by mouth daily with breakfast. Increase to twice daily if well tolerated 180 tablet 3   rosuvastatin (CRESTOR) 10 MG tablet Take 1 tablet (10 mg total) by mouth daily. 90 tablet 3   terbinafine (LAMISIL) 250 MG tablet Take 1 tablet (250 mg total) by mouth daily. Take daily for 6 weeks 45 tablet 0   No current facility-administered medications on file prior to visit.    Review of Systems:  As per HPI- otherwise negative.   Physical Examination: Vitals:   01/04/22 1049  BP: 136/80  Pulse: 91  Resp: 18  Temp: 97.8 F (36.6 C)  SpO2: 97%   Vitals:   01/04/22 1049  Weight: 224 lb 12.8 oz (102 kg)  Height: 5\' 10"  (1.778 m)   Body mass index is 32.26 kg/m. Ideal Body Weight: Weight in (lb) to have BMI = 25: 173.9   GEN: No acute distress; alert,appropriate. PULM: Breathing comfortably in no respiratory distress PSYCH: Normally interactive.  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  I am able to insert a Q-tip underneath the skin bridge and have it come out on the other side.  Patient states he has been aware of the skin bridge was present for a few years.  It does not typically cause him any problems   Assessment and Plan: Penile abnormality - Plan: Ambulatory referral to Urology, Cytology (oral, anal, urethral) ancillary only  Patient seen today for follow-up.  Discussed findings as above, I suspect he has gotten a bit of localized infection due to  material getting trapped under the skin bridge as described above.  This did not appear to be an STI after all.  We will go ahead and do a gonorrhea and chlamydia swab as he had been exposed to a new partner Otherwise, referral to urology for potential further treatment of the skin bridge  Signed Abbe Amsterdam, MD

## 2022-01-04 ENCOUNTER — Ambulatory Visit (INDEPENDENT_AMBULATORY_CARE_PROVIDER_SITE_OTHER): Payer: BC Managed Care – PPO | Admitting: Family Medicine

## 2022-01-04 ENCOUNTER — Telehealth: Payer: Self-pay | Admitting: Family Medicine

## 2022-01-04 VITALS — BP 136/80 | HR 91 | Temp 97.8°F | Resp 18 | Ht 70.0 in | Wt 224.8 lb

## 2022-01-04 DIAGNOSIS — N489 Disorder of penis, unspecified: Secondary | ICD-10-CM

## 2022-01-04 NOTE — Telephone Encounter (Signed)
Correction made to resulting agency with the help of Trish.

## 2022-01-04 NOTE — Patient Instructions (Signed)
Good to see you today!  I will be in touch with your results and we will get you seen by urology asap

## 2022-01-04 NOTE — Addendum Note (Signed)
Addended by: CREFT, Feliberto Harts on: 01/04/2022 01:21 PM   Modules accepted: Orders

## 2022-01-04 NOTE — Telephone Encounter (Signed)
[facility] called stating that the wrong swab was used to collect the sample for pt cytology lab. Rep stated it will need to be redone in order to run tests regarding this.

## 2022-01-05 LAB — C. TRACHOMATIS/N. GONORRHOEAE RNA
C. trachomatis RNA, TMA: NOT DETECTED
N. gonorrhoeae RNA, TMA: NOT DETECTED

## 2022-01-06 ENCOUNTER — Encounter: Payer: Self-pay | Admitting: Family Medicine

## 2022-02-09 ENCOUNTER — Other Ambulatory Visit (HOSPITAL_BASED_OUTPATIENT_CLINIC_OR_DEPARTMENT_OTHER): Payer: Self-pay

## 2022-03-15 DIAGNOSIS — N489 Disorder of penis, unspecified: Secondary | ICD-10-CM | POA: Diagnosis not present

## 2022-04-02 ENCOUNTER — Other Ambulatory Visit (HOSPITAL_BASED_OUTPATIENT_CLINIC_OR_DEPARTMENT_OTHER): Payer: Self-pay

## 2022-05-03 ENCOUNTER — Other Ambulatory Visit: Payer: Self-pay | Admitting: Family Medicine

## 2022-05-03 ENCOUNTER — Other Ambulatory Visit (HOSPITAL_BASED_OUTPATIENT_CLINIC_OR_DEPARTMENT_OTHER): Payer: Self-pay

## 2022-05-03 DIAGNOSIS — E785 Hyperlipidemia, unspecified: Secondary | ICD-10-CM

## 2022-05-03 DIAGNOSIS — R7303 Prediabetes: Secondary | ICD-10-CM

## 2022-05-03 MED ORDER — ROSUVASTATIN CALCIUM 10 MG PO TABS
10.0000 mg | ORAL_TABLET | Freq: Every day | ORAL | 0 refills | Status: DC
  Filled 2022-05-03: qty 30, 30d supply, fill #0
  Filled 2022-07-06: qty 30, 30d supply, fill #1
  Filled 2022-08-08: qty 30, 30d supply, fill #2

## 2022-05-03 MED ORDER — METFORMIN HCL ER 500 MG PO TB24
500.0000 mg | ORAL_TABLET | Freq: Every day | ORAL | 0 refills | Status: DC
  Filled 2022-05-03: qty 60, 30d supply, fill #0
  Filled 2022-07-06: qty 60, 30d supply, fill #1
  Filled 2022-08-08: qty 60, 30d supply, fill #2

## 2022-05-10 DIAGNOSIS — N489 Disorder of penis, unspecified: Secondary | ICD-10-CM | POA: Diagnosis not present

## 2022-05-16 ENCOUNTER — Other Ambulatory Visit (HOSPITAL_BASED_OUTPATIENT_CLINIC_OR_DEPARTMENT_OTHER): Payer: Self-pay

## 2022-05-16 DIAGNOSIS — N481 Balanitis: Secondary | ICD-10-CM | POA: Diagnosis not present

## 2022-05-16 DIAGNOSIS — I517 Cardiomegaly: Secondary | ICD-10-CM | POA: Diagnosis not present

## 2022-05-16 DIAGNOSIS — Z01818 Encounter for other preprocedural examination: Secondary | ICD-10-CM | POA: Diagnosis not present

## 2022-05-16 DIAGNOSIS — N489 Disorder of penis, unspecified: Secondary | ICD-10-CM | POA: Diagnosis not present

## 2022-05-16 DIAGNOSIS — N475 Adhesions of prepuce and glans penis: Secondary | ICD-10-CM | POA: Diagnosis not present

## 2022-05-16 MED ORDER — HYDROCODONE-ACETAMINOPHEN 5-325 MG PO TABS
1.0000 | ORAL_TABLET | Freq: Four times a day (QID) | ORAL | 0 refills | Status: AC | PRN
  Filled 2022-05-16: qty 15, 4d supply, fill #0

## 2022-08-23 DIAGNOSIS — N480 Leukoplakia of penis: Secondary | ICD-10-CM | POA: Diagnosis not present

## 2022-09-13 ENCOUNTER — Other Ambulatory Visit: Payer: Self-pay | Admitting: Family Medicine

## 2022-09-13 ENCOUNTER — Other Ambulatory Visit (HOSPITAL_BASED_OUTPATIENT_CLINIC_OR_DEPARTMENT_OTHER): Payer: Self-pay

## 2022-09-13 DIAGNOSIS — E785 Hyperlipidemia, unspecified: Secondary | ICD-10-CM

## 2022-09-13 DIAGNOSIS — R7303 Prediabetes: Secondary | ICD-10-CM

## 2022-09-13 MED ORDER — ROSUVASTATIN CALCIUM 10 MG PO TABS
10.0000 mg | ORAL_TABLET | Freq: Every day | ORAL | 0 refills | Status: DC
  Filled 2022-09-13: qty 30, 30d supply, fill #0
  Filled 2022-10-30: qty 30, 30d supply, fill #1
  Filled 2022-11-29: qty 30, 30d supply, fill #2

## 2022-09-13 MED ORDER — METFORMIN HCL ER 500 MG PO TB24
500.0000 mg | ORAL_TABLET | Freq: Every day | ORAL | 0 refills | Status: DC
  Filled 2022-09-13: qty 60, 30d supply, fill #0
  Filled 2022-10-30: qty 60, 30d supply, fill #1
  Filled 2022-11-29: qty 60, 30d supply, fill #2

## 2022-11-30 ENCOUNTER — Other Ambulatory Visit (HOSPITAL_BASED_OUTPATIENT_CLINIC_OR_DEPARTMENT_OTHER): Payer: Self-pay

## 2023-01-09 ENCOUNTER — Other Ambulatory Visit: Payer: Self-pay

## 2023-01-09 ENCOUNTER — Other Ambulatory Visit (HOSPITAL_BASED_OUTPATIENT_CLINIC_OR_DEPARTMENT_OTHER): Payer: Self-pay

## 2023-01-09 ENCOUNTER — Other Ambulatory Visit: Payer: Self-pay | Admitting: Family Medicine

## 2023-01-09 DIAGNOSIS — E785 Hyperlipidemia, unspecified: Secondary | ICD-10-CM

## 2023-01-09 DIAGNOSIS — R7303 Prediabetes: Secondary | ICD-10-CM

## 2023-01-09 MED ORDER — METFORMIN HCL ER 500 MG PO TB24
500.0000 mg | ORAL_TABLET | Freq: Every day | ORAL | 0 refills | Status: AC
Start: 2023-01-09 — End: ?
  Filled 2023-01-09: qty 60, 30d supply, fill #0

## 2023-01-09 MED ORDER — ROSUVASTATIN CALCIUM 10 MG PO TABS
10.0000 mg | ORAL_TABLET | Freq: Every day | ORAL | 0 refills | Status: AC
  Filled 2023-01-09: qty 30, 30d supply, fill #0

## 2023-02-15 ENCOUNTER — Other Ambulatory Visit: Payer: Self-pay | Admitting: Family Medicine

## 2023-02-15 DIAGNOSIS — R7303 Prediabetes: Secondary | ICD-10-CM

## 2023-02-15 DIAGNOSIS — E785 Hyperlipidemia, unspecified: Secondary | ICD-10-CM

## 2023-02-18 ENCOUNTER — Other Ambulatory Visit (HOSPITAL_BASED_OUTPATIENT_CLINIC_OR_DEPARTMENT_OTHER): Payer: Self-pay
